# Patient Record
Sex: Male | Born: 1965 | Race: White | Hispanic: No | Marital: Married | Smoking: Never smoker
Health system: Southern US, Community
[De-identification: ages and names within clinical notes are randomized; demographics above are authoritative.]

## PROBLEM LIST (undated history)

## (undated) DIAGNOSIS — K219 Gastro-esophageal reflux disease without esophagitis: Secondary | ICD-10-CM

## (undated) DIAGNOSIS — G35D Multiple sclerosis, unspecified: Secondary | ICD-10-CM

## (undated) DIAGNOSIS — K449 Diaphragmatic hernia without obstruction or gangrene: Secondary | ICD-10-CM

## (undated) DIAGNOSIS — K3189 Other diseases of stomach and duodenum: Secondary | ICD-10-CM

## (undated) DIAGNOSIS — E7800 Pure hypercholesterolemia, unspecified: Secondary | ICD-10-CM

## (undated) DIAGNOSIS — G35 Multiple sclerosis: Secondary | ICD-10-CM

## (undated) DIAGNOSIS — R1013 Epigastric pain: Secondary | ICD-10-CM

## (undated) DIAGNOSIS — J36 Peritonsillar abscess: Secondary | ICD-10-CM

## (undated) HISTORY — DX: Multiple sclerosis, unspecified: G35.D

## (undated) HISTORY — DX: Other disorders of bilirubin metabolism: E80.6

## (undated) HISTORY — DX: Diaphragmatic hernia without obstruction or gangrene: K44.9

## (undated) HISTORY — DX: Pure hypercholesterolemia, unspecified: E78.00

## (undated) HISTORY — PX: WISDOM TOOTH EXTRACTION: SHX21

## (undated) HISTORY — DX: Peritonsillar abscess: J36

## (undated) HISTORY — DX: Epigastric pain: R10.13

## (undated) HISTORY — PX: CERVICAL DISCECTOMY: SHX98

## (undated) HISTORY — DX: Multiple sclerosis: G35

## (undated) HISTORY — PX: LASIK: SHX215

## (undated) HISTORY — DX: Other diseases of stomach and duodenum: K31.89

---

## 2009-12-19 ENCOUNTER — Encounter: Payer: Self-pay | Admitting: Family Medicine

## 2009-12-19 LAB — CONVERTED CEMR LAB
Cholesterol: 203 mg/dL
Triglycerides: 175 mg/dL

## 2010-09-14 ENCOUNTER — Encounter: Payer: Self-pay | Admitting: Family Medicine

## 2010-09-14 DIAGNOSIS — K449 Diaphragmatic hernia without obstruction or gangrene: Secondary | ICD-10-CM | POA: Insufficient documentation

## 2010-09-14 DIAGNOSIS — J36 Peritonsillar abscess: Secondary | ICD-10-CM | POA: Insufficient documentation

## 2010-09-14 DIAGNOSIS — E7800 Pure hypercholesterolemia, unspecified: Secondary | ICD-10-CM | POA: Insufficient documentation

## 2010-09-14 DIAGNOSIS — K3189 Other diseases of stomach and duodenum: Secondary | ICD-10-CM | POA: Insufficient documentation

## 2010-09-14 DIAGNOSIS — R1013 Epigastric pain: Secondary | ICD-10-CM

## 2010-10-23 ENCOUNTER — Encounter: Payer: Self-pay | Admitting: Family Medicine

## 2010-10-27 ENCOUNTER — Encounter: Payer: Self-pay | Admitting: Family Medicine

## 2010-10-27 ENCOUNTER — Ambulatory Visit (INDEPENDENT_AMBULATORY_CARE_PROVIDER_SITE_OTHER): Payer: Self-pay | Admitting: Family Medicine

## 2010-10-27 DIAGNOSIS — R1013 Epigastric pain: Secondary | ICD-10-CM

## 2010-10-27 DIAGNOSIS — E7800 Pure hypercholesterolemia, unspecified: Secondary | ICD-10-CM

## 2010-10-27 DIAGNOSIS — J069 Acute upper respiratory infection, unspecified: Secondary | ICD-10-CM | POA: Insufficient documentation

## 2010-10-27 DIAGNOSIS — M5090 Cervical disc disorder, unspecified, unspecified cervical region: Secondary | ICD-10-CM

## 2010-10-27 DIAGNOSIS — G35 Multiple sclerosis: Secondary | ICD-10-CM | POA: Insufficient documentation

## 2010-10-27 DIAGNOSIS — K3189 Other diseases of stomach and duodenum: Secondary | ICD-10-CM

## 2010-10-27 MED ORDER — ATORVASTATIN CALCIUM 20 MG PO TABS: 20.0000 mg | ORAL_TABLET | Freq: Every day | ORAL | Status: DC

## 2010-10-27 MED ORDER — LANSOPRAZOLE 30 MG PO CPDR: 30.0000 mg | DELAYED_RELEASE_CAPSULE | Freq: Every day | ORAL | Status: DC

## 2010-10-27 NOTE — Assessment & Plan Note (Signed)
Check lipids in the summer.  Continue meds for now.

## 2010-10-27 NOTE — Assessment & Plan Note (Signed)
Controlled with current meds.

## 2010-10-27 NOTE — Assessment & Plan Note (Signed)
Per neuro 

## 2010-10-27 NOTE — Progress Notes (Signed)
Reest care. Prev [facility] patient.   Elevated Cholesterol: Using medications without problems:yes Muscle aches: no Other complaints:no Exercising Due for labs in the summer.   MS- has had follow up with neuro.  Takes tylenol prn for sweats with the interferon.  Only with sensory sx- dec in sensation on R post thigh, R foot, and R chest wall.  No weakness.  Compliant with meds.    Recent URI sx.  Cough and congestion w/o fever.  Going on for about 1 week.  Some sputum.  Overall with gradual improvement.   Known HH and GERD- controlled with current meds.   PMH and SH reviewed  Meds, vitals, and allergies reviewed.   ROS: See HPI.  Otherwise negative.    GEN: nad, alert and oriented HEENT: mucous membranes moist, tm w/o erythema, nasal exam w/o erythema, clear discharge noted,  OP with cobblestoning, frontal/max sinuses not ttp NECK: supple w/o LA CV: rrr.   PULM: ctab, no inc wob EXT: no edema SKIN: no acute rash CN 2-12 wnl B, S/S/DTR wnl x4, except for dec in sensation on R side of chest and as above on R leg.

## 2010-10-27 NOTE — Patient Instructions (Signed)
Schedule a lab appointment in: 3months, come in fasting. Don't change your meds in the meantime.  Let me know if your cough doesn't gradually improve/resolve. Take care. Glad to see you today.

## 2010-10-27 NOTE — Assessment & Plan Note (Addendum)
See instructions.  Supportive tx and notify the clinic if not resolving or if progressive.  He agrees.  Nontoxic and likely viral.  Lungs clear.

## 2011-01-27 ENCOUNTER — Other Ambulatory Visit (INDEPENDENT_AMBULATORY_CARE_PROVIDER_SITE_OTHER): Payer: Federal, State, Local not specified - PPO

## 2011-01-27 DIAGNOSIS — E7800 Pure hypercholesterolemia, unspecified: Secondary | ICD-10-CM

## 2011-01-27 LAB — LIPID PANEL
HDL: 51.7 mg/dL (ref >39.00)
Total CHOL/HDL Ratio: 4
Triglycerides: 114 mg/dL (ref 0.0–149.0)
VLDL: 22.8 mg/dL (ref 0.0–40.0)

## 2011-09-14 ENCOUNTER — Telehealth: Payer: Self-pay | Admitting: *Deleted

## 2011-09-14 NOTE — Telephone Encounter (Signed)
Lyla Son says the patient called in this morning to make an appt for a CPE and was upset that we could not give that to him until July.  Lyla Son asked if she could fit him in sooner than that because he needed the paperwork for SS filled out.  Appt was made in May which suited the patient but then the wife brought in the form and left it with a notation that they need it by Thursday.  Form in your in box.

## 2011-09-15 NOTE — Telephone Encounter (Signed)
I've filled out the form based on the information from 2/12.  That info won't be valid in May 2013.  I would shred this form and bring in a clean one in May.  I wouldn't hold the current form.

## 2011-09-15 NOTE — Telephone Encounter (Signed)
Spoke to patient and was advised that he is not coming in until May and will have the TB test done at that time. Hold form until that appointment per patient.

## 2011-09-15 NOTE — Telephone Encounter (Signed)
Patient's wife notified by telephone to have him bring in a new form to be completed at his appointment.

## 2011-09-15 NOTE — Telephone Encounter (Signed)
Pt needs a TB test result that is current (from the last year).  If he has one, I'll add it to the form. If not, he'll need another one placed and then read 48-72 hours later.  That will mean the form won't be done by Thursday.

## 2011-09-15 NOTE — Telephone Encounter (Signed)
Left message on voice mail  to call back

## 2011-09-16 ENCOUNTER — Telehealth: Payer: Self-pay | Admitting: Family Medicine

## 2011-09-16 NOTE — Telephone Encounter (Signed)
If the prev form was shredded, then have them send a clean copy and I'll fill out.  If we still have the old one, I'll finish it.

## 2011-09-16 NOTE — Telephone Encounter (Signed)
Pt's wife is calling about a form that was dropped off for her husband and Rene Kocher was working with her on it. Rene Kocher called to tell them know that they couldn't complete the form without her husband getting a TB skin test. She called her social worker and she said the he doesn't have to have a TB skin test every year for the form because it is a renewal. She said if there were any questions to contact her or the Child psychotherapist. Number to Social worker Frederich Balding: Cell [phone]. [phone]  Work [phone]

## 2011-09-16 NOTE — Telephone Encounter (Signed)
Pt came and picked up form and I sent a copy to be scanned.

## 2011-09-16 NOTE — Telephone Encounter (Signed)
Form is back in your in box.

## 2011-09-16 NOTE — Telephone Encounter (Signed)
Signed, please scan and give to pt.

## 2011-10-19 ENCOUNTER — Other Ambulatory Visit: Payer: Self-pay | Admitting: *Deleted

## 2011-10-19 DIAGNOSIS — R1013 Epigastric pain: Secondary | ICD-10-CM

## 2011-10-19 DIAGNOSIS — E7800 Pure hypercholesterolemia, unspecified: Secondary | ICD-10-CM

## 2011-10-19 MED ORDER — ATORVASTATIN CALCIUM 20 MG PO TABS: 20.0000 mg | ORAL_TABLET | Freq: Every day | ORAL | Status: DC

## 2011-10-19 MED ORDER — LANSOPRAZOLE 30 MG PO CPDR: 30.0000 mg | DELAYED_RELEASE_CAPSULE | Freq: Every day | ORAL | Status: DC

## 2011-12-06 ENCOUNTER — Encounter: Payer: Self-pay | Admitting: Family Medicine

## 2011-12-06 ENCOUNTER — Ambulatory Visit (INDEPENDENT_AMBULATORY_CARE_PROVIDER_SITE_OTHER): Payer: Federal, State, Local not specified - PPO | Admitting: Family Medicine

## 2011-12-06 VITALS — BP 124/76 | HR 73 | Temp 98.6°F | Wt 236.0 lb

## 2011-12-06 DIAGNOSIS — M5090 Cervical disc disorder, unspecified, unspecified cervical region: Secondary | ICD-10-CM

## 2011-12-06 DIAGNOSIS — Z0000 Encounter for general adult medical examination without abnormal findings: Secondary | ICD-10-CM

## 2011-12-06 DIAGNOSIS — E7800 Pure hypercholesterolemia, unspecified: Secondary | ICD-10-CM

## 2011-12-06 DIAGNOSIS — G35 Multiple sclerosis: Secondary | ICD-10-CM

## 2011-12-06 LAB — COMPREHENSIVE METABOLIC PANEL
AST: 48 U/L — ABNORMAL HIGH (ref 0–37)
BUN: 21 mg/dL (ref 6–23)
Calcium: 9.3 mg/dL (ref 8.4–10.5)
Chloride: 107 mEq/L (ref 96–112)
Creatinine, Ser: 1 mg/dL (ref 0.4–1.5)
GFR: 81.95 mL/min (ref >60.00)
Total Bilirubin: 3.2 mg/dL — ABNORMAL HIGH (ref 0.3–1.2)

## 2011-12-06 LAB — LIPID PANEL
Cholesterol: 217 mg/dL — ABNORMAL HIGH (ref 0–200)
Total CHOL/HDL Ratio: 4
Triglycerides: 119 mg/dL (ref 0.0–149.0)
VLDL: 23.8 mg/dL (ref 0.0–40.0)

## 2011-12-06 NOTE — Patient Instructions (Addendum)
We'll contact you with your lab report. Check your lipitor dose and notify us.  Take care.  Glad to see you.

## 2011-12-06 NOTE — Progress Notes (Signed)
CPE- See plan.  Routine anticipatory guidance given to patient.  See health maintenance. Due for labs.   Td 2008 Flu done at work  Elevated Cholesterol: Using medications without problems:yes Muscle aches: no, pain likely due to neck pain or possibly MS Diet compliance: yes Exercise: as tolerated, limited by pain  MS per neuro.  On interferon.  Has f/u with neurosurgery later today about neck pain.    PMH and SH reviewed  Meds, vitals, and allergies reviewed.   ROS: See HPI.  Otherwise negative.    GEN: nad, alert and oriented HEENT: mucous membranes moist NECK: w/o LA or TMG CV: rrr. PULM: ctab, no inc wob ABD: soft, +bs EXT: no edema SKIN: no acute rash

## 2011-12-07 DIAGNOSIS — K760 Fatty (change of) liver, not elsewhere classified: Secondary | ICD-10-CM | POA: Insufficient documentation

## 2011-12-07 NOTE — Assessment & Plan Note (Signed)
Mild, recheck in 1 month.  See notes on labs.

## 2011-12-09 DIAGNOSIS — Z0000 Encounter for general adult medical examination without abnormal findings: Secondary | ICD-10-CM | POA: Insufficient documentation

## 2011-12-09 NOTE — Assessment & Plan Note (Signed)
Routine anticipatory guidance given to patient.  See health maintenance. Due for labs.   See notes on labs.  Td 2008 Flu done at work

## 2011-12-09 NOTE — Assessment & Plan Note (Signed)
Per neurosurgery  

## 2011-12-09 NOTE — Assessment & Plan Note (Signed)
Per neuro 

## 2011-12-31 ENCOUNTER — Other Ambulatory Visit (INDEPENDENT_AMBULATORY_CARE_PROVIDER_SITE_OTHER): Payer: Federal, State, Local not specified - PPO

## 2011-12-31 ENCOUNTER — Other Ambulatory Visit: Payer: Self-pay | Admitting: Family Medicine

## 2011-12-31 DIAGNOSIS — R1013 Epigastric pain: Secondary | ICD-10-CM

## 2011-12-31 DIAGNOSIS — K3189 Other diseases of stomach and duodenum: Secondary | ICD-10-CM

## 2011-12-31 LAB — HEPATIC FUNCTION PANEL
ALT: 93 U/L — ABNORMAL HIGH (ref 0–53)
Alkaline Phosphatase: 62 U/L (ref 39–117)
Bilirubin, Direct: 0.2 mg/dL (ref 0.0–0.3)
Total Bilirubin: 2.1 mg/dL — ABNORMAL HIGH (ref 0.3–1.2)

## 2011-12-31 MED ORDER — ATORVASTATIN CALCIUM 10 MG PO TABS: 10.0000 mg | ORAL_TABLET | Freq: Every day | ORAL | Status: DC

## 2011-12-31 MED ORDER — LANSOPRAZOLE 30 MG PO CPDR: 30.0000 mg | DELAYED_RELEASE_CAPSULE | Freq: Every day | ORAL | Status: DC

## 2011-12-31 NOTE — Progress Notes (Signed)
Addended by: Baldomero Lamy on: 12/31/2011 07:59 AM   Modules accepted: Orders

## 2012-01-06 ENCOUNTER — Other Ambulatory Visit: Payer: Self-pay | Admitting: Neurological Surgery

## 2012-01-17 ENCOUNTER — Other Ambulatory Visit (INDEPENDENT_AMBULATORY_CARE_PROVIDER_SITE_OTHER): Payer: Federal, State, Local not specified - PPO

## 2012-01-18 LAB — HEPATITIS B SURFACE ANTIGEN: Hepatitis B Surface Ag: NEGATIVE

## 2012-01-20 ENCOUNTER — Ambulatory Visit
Admission: RE | Admit: 2012-01-20 | Discharge: 2012-01-20 | Disposition: A | Discharge status: Home / Self Care | Payer: Federal, State, Local not specified - PPO | Source: Ambulatory Visit | Attending: Family Medicine | Admitting: Family Medicine

## 2012-01-20 ENCOUNTER — Encounter: Payer: Self-pay | Admitting: Family Medicine

## 2012-02-02 ENCOUNTER — Encounter (HOSPITAL_COMMUNITY): Payer: Self-pay | Admitting: Pharmacy Technician

## 2012-02-09 ENCOUNTER — Encounter (HOSPITAL_COMMUNITY)
Admission: RE | Admit: 2012-02-09 | Discharge: 2012-02-09 | Disposition: A | Discharge status: Home / Self Care | Payer: Federal, State, Local not specified - PPO | Source: Ambulatory Visit | Attending: Neurological Surgery | Admitting: Neurological Surgery

## 2012-02-09 ENCOUNTER — Encounter (HOSPITAL_COMMUNITY): Payer: Self-pay

## 2012-02-09 HISTORY — DX: Gastro-esophageal reflux disease without esophagitis: K21.9

## 2012-02-09 LAB — CBC WITH DIFFERENTIAL/PLATELET
Basophils Absolute: 0 10*3/uL (ref 0.0–0.1)
Basophils Relative: 0 % (ref 0–1)
HCT: 46.2 % (ref 39.0–52.0)
Hemoglobin: 16.6 g/dL (ref 13.0–17.0)
Lymphocytes Relative: 34 % (ref 12–46)
MCHC: 35.9 g/dL (ref 30.0–36.0)
Monocytes Absolute: 0.7 10*3/uL (ref 0.1–1.0)
Monocytes Relative: 14 % — ABNORMAL HIGH (ref 3–12)
Neutro Abs: 2.2 10*3/uL (ref 1.7–7.7)
Neutrophils Relative %: 48 % (ref 43–77)
WBC: 4.7 10*3/uL (ref 4.0–10.5)

## 2012-02-09 LAB — BASIC METABOLIC PANEL
BUN: 18 mg/dL (ref 6–23)
CO2: 27 mEq/L (ref 19–32)
Chloride: 102 mEq/L (ref 96–112)
Creatinine, Ser: 1.12 mg/dL (ref 0.50–1.35)
GFR calc Af Amer: 88 mL/min — ABNORMAL LOW (ref >90)
Potassium: 3.9 mEq/L (ref 3.5–5.1)

## 2012-02-09 LAB — PROTIME-INR: Prothrombin Time: 13.2 seconds (ref 11.6–15.2)

## 2012-02-09 LAB — SURGICAL PCR SCREEN: MRSA, PCR: NEGATIVE

## 2012-02-09 NOTE — Pre-Procedure Instructions (Signed)
18 Walter Rocha  02/09/2012   Your procedure is scheduled on:  February 16, 2012 Wednesday at 0830 AM  Report to [facility] at [phone].  Call this number if you have problems the morning of surgery: [phone]   Remember:   Do not eat food or drink:After Midnight.Tuesday   .  Take these medicines the morning of surgery with A SIP OF WATER: Tylenol, and Prevacid   Do not wear jewelry,   Do not wear lotions, powders, or perfumes. You may wear deodorant.  Do not shave 48 hours prior to surgery. Men may shave face and neck.  Do not bring valuables to the hospital.  Contacts, dentures or bridgework may not be worn into surgery.  Leave suitcase in the car. After surgery it may be brought to your room.  For patients admitted to the hospital, checkout time is 11:00 AM the day of discharge.   Patients discharged the day of surgery will not be allowed to drive home.    Special Instructions: CHG Shower Use Special Wash: 1/2 bottle night before surgery and 1/2 bottle morning of surgery.   Please read over the following fact sheets that you were given: Pain Booklet, Coughing and Deep Breathing, MRSA Information and Surgical Site Infection Prevention

## 2012-02-15 MED ORDER — DEXAMETHASONE SODIUM PHOSPHATE 10 MG/ML IJ SOLN
10.0000 mg | INTRAMUSCULAR | Status: AC
  Administered 2012-02-16: 10 mg via INTRAVENOUS
  Filled 2012-02-15: qty 1

## 2012-02-15 MED ORDER — CEFAZOLIN SODIUM-DEXTROSE 2-3 GM-% IV SOLR
2.0000 g | INTRAVENOUS | Status: DC
  Filled 2012-02-15: qty 50

## 2012-02-16 ENCOUNTER — Encounter (HOSPITAL_COMMUNITY): Payer: Self-pay | Admitting: Certified Registered"

## 2012-02-16 ENCOUNTER — Encounter (HOSPITAL_COMMUNITY): Payer: Self-pay | Admitting: Surgery

## 2012-02-16 ENCOUNTER — Encounter (HOSPITAL_COMMUNITY): Payer: Self-pay | Admitting: Neurological Surgery

## 2012-02-16 ENCOUNTER — Ambulatory Visit (HOSPITAL_COMMUNITY): Payer: Federal, State, Local not specified - PPO | Admitting: Certified Registered"

## 2012-02-16 ENCOUNTER — Encounter (HOSPITAL_COMMUNITY)
Admission: RE | Disposition: A | Discharge status: Home / Self Care | Payer: Self-pay | Source: Ambulatory Visit | Attending: Neurological Surgery

## 2012-02-16 ENCOUNTER — Ambulatory Visit (HOSPITAL_COMMUNITY)
Admission: RE | Admit: 2012-02-16 | Discharge: 2012-02-17 | Disposition: A | Discharge status: Home / Self Care | Payer: Federal, State, Local not specified - PPO | Source: Ambulatory Visit | Attending: Neurological Surgery | Admitting: Neurological Surgery

## 2012-02-16 ENCOUNTER — Ambulatory Visit (HOSPITAL_COMMUNITY): Payer: Federal, State, Local not specified - PPO

## 2012-02-16 DIAGNOSIS — Z01818 Encounter for other preprocedural examination: Secondary | ICD-10-CM | POA: Insufficient documentation

## 2012-02-16 DIAGNOSIS — M5090 Cervical disc disorder, unspecified, unspecified cervical region: Secondary | ICD-10-CM

## 2012-02-16 DIAGNOSIS — M47812 Spondylosis without myelopathy or radiculopathy, cervical region: Secondary | ICD-10-CM | POA: Insufficient documentation

## 2012-02-16 DIAGNOSIS — K219 Gastro-esophageal reflux disease without esophagitis: Secondary | ICD-10-CM | POA: Insufficient documentation

## 2012-02-16 DIAGNOSIS — Z01810 Encounter for preprocedural cardiovascular examination: Secondary | ICD-10-CM | POA: Insufficient documentation

## 2012-02-16 DIAGNOSIS — R1013 Epigastric pain: Secondary | ICD-10-CM

## 2012-02-16 DIAGNOSIS — G35 Multiple sclerosis: Secondary | ICD-10-CM | POA: Insufficient documentation

## 2012-02-16 DIAGNOSIS — Z01812 Encounter for preprocedural laboratory examination: Secondary | ICD-10-CM | POA: Insufficient documentation

## 2012-02-16 HISTORY — PX: ANTERIOR CERVICAL DECOMP/DISCECTOMY FUSION: SHX1161

## 2012-02-16 SURGERY — ANTERIOR CERVICAL DECOMPRESSION/DISCECTOMY FUSION 2 LEVELS
Anesthesia: General | Site: Spine Cervical | Wound class: Clean

## 2012-02-16 MED ORDER — LACTATED RINGERS IV SOLN
INTRAVENOUS | Status: DC | PRN
  Administered 2012-02-16 (×2): via INTRAVENOUS

## 2012-02-16 MED ORDER — SODIUM CHLORIDE 0.9 % IJ SOLN
3.0000 mL | Freq: Two times a day (BID) | INTRAMUSCULAR | Status: DC
  Administered 2012-02-16 (×2): 3 mL via INTRAVENOUS

## 2012-02-16 MED ORDER — PHENYLEPHRINE HCL 10 MG/ML IJ SOLN
INTRAMUSCULAR | Status: DC | PRN
  Administered 2012-02-16 (×3): 80 ug via INTRAVENOUS

## 2012-02-16 MED ORDER — ZOLPIDEM TARTRATE 5 MG PO TABS: 5.0000 mg | ORAL_TABLET | Freq: Every evening | ORAL | Status: DC | PRN

## 2012-02-16 MED ORDER — ROCURONIUM BROMIDE 100 MG/10ML IV SOLN
INTRAVENOUS | Status: DC | PRN
  Administered 2012-02-16: 10 mg via INTRAVENOUS
  Administered 2012-02-16: 50 mg via INTRAVENOUS
  Administered 2012-02-16 (×3): 10 mg via INTRAVENOUS

## 2012-02-16 MED ORDER — FENTANYL CITRATE 0.05 MG/ML IJ SOLN
INTRAMUSCULAR | Status: DC | PRN
  Administered 2012-02-16 (×2): 50 ug via INTRAVENOUS
  Administered 2012-02-16: 100 ug via INTRAVENOUS
  Administered 2012-02-16: 50 ug via INTRAVENOUS
  Administered 2012-02-16: 100 ug via INTRAVENOUS

## 2012-02-16 MED ORDER — GLYCOPYRROLATE 0.2 MG/ML IJ SOLN
INTRAMUSCULAR | Status: DC | PRN
  Administered 2012-02-16: 0.4 mg via INTRAVENOUS

## 2012-02-16 MED ORDER — SODIUM CHLORIDE 0.9 % IR SOLN
Status: DC | PRN
  Administered 2012-02-16: 09:00:00

## 2012-02-16 MED ORDER — ACETAMINOPHEN 650 MG RE SUPP: 650.0000 mg | RECTAL | Status: DC | PRN

## 2012-02-16 MED ORDER — MIDAZOLAM HCL 5 MG/5ML IJ SOLN
INTRAMUSCULAR | Status: DC | PRN
  Administered 2012-02-16: 2 mg via INTRAVENOUS

## 2012-02-16 MED ORDER — HYDROMORPHONE HCL PF 1 MG/ML IJ SOLN: 0.2500 mg | INTRAMUSCULAR | Status: DC | PRN

## 2012-02-16 MED ORDER — SODIUM CHLORIDE 0.9 % IJ SOLN: 3.0000 mL | INTRAMUSCULAR | Status: DC | PRN

## 2012-02-16 MED ORDER — SODIUM CHLORIDE 0.9 % IV SOLN
INTRAVENOUS | Status: AC
  Filled 2012-02-16: qty 500

## 2012-02-16 MED ORDER — CYCLOBENZAPRINE HCL 10 MG PO TABS
10.0000 mg | ORAL_TABLET | Freq: Three times a day (TID) | ORAL | Status: DC | PRN
  Administered 2012-02-16 – 2012-02-17 (×2): 10 mg via ORAL
  Filled 2012-02-16 (×2): qty 1

## 2012-02-16 MED ORDER — MENTHOL 3 MG MT LOZG
1.0000 | LOZENGE | OROMUCOSAL | Status: DC | PRN
  Administered 2012-02-16: 3 mg via ORAL
  Filled 2012-02-16: qty 9

## 2012-02-16 MED ORDER — LIDOCAINE HCL (CARDIAC) 20 MG/ML IV SOLN
INTRAVENOUS | Status: DC | PRN
  Administered 2012-02-16: 100 mg via INTRAVENOUS

## 2012-02-16 MED ORDER — THROMBIN 5000 UNITS EX KIT
PACK | CUTANEOUS | Status: DC | PRN
  Administered 2012-02-16 (×2): 5000 [IU] via TOPICAL

## 2012-02-16 MED ORDER — OXYCODONE-ACETAMINOPHEN 5-325 MG PO TABS: 1.0000 | ORAL_TABLET | ORAL | Status: DC | PRN

## 2012-02-16 MED ORDER — ONDANSETRON HCL 4 MG/2ML IJ SOLN: 4.0000 mg | INTRAMUSCULAR | Status: DC | PRN

## 2012-02-16 MED ORDER — BUPIVACAINE HCL (PF) 0.25 % IJ SOLN
INTRAMUSCULAR | Status: DC | PRN
  Administered 2012-02-16: 4 mL

## 2012-02-16 MED ORDER — BACITRACIN 50000 UNITS IM SOLR
INTRAMUSCULAR | Status: AC
  Filled 2012-02-16: qty 1

## 2012-02-16 MED ORDER — CEFAZOLIN SODIUM 1-5 GM-% IV SOLN
1.0000 g | Freq: Three times a day (TID) | INTRAVENOUS | Status: AC
  Administered 2012-02-16 (×2): 1 g via INTRAVENOUS
  Filled 2012-02-16 (×2): qty 50

## 2012-02-16 MED ORDER — EPHEDRINE SULFATE 50 MG/ML IJ SOLN
INTRAMUSCULAR | Status: DC | PRN
  Administered 2012-02-16 (×2): 5 mg via INTRAVENOUS

## 2012-02-16 MED ORDER — SENNA 8.6 MG PO TABS
1.0000 | ORAL_TABLET | Freq: Two times a day (BID) | ORAL | Status: DC
  Administered 2012-02-17: 8.6 mg via ORAL
  Filled 2012-02-16 (×2): qty 1

## 2012-02-16 MED ORDER — PROMETHAZINE HCL 25 MG/ML IJ SOLN: 6.2500 mg | INTRAMUSCULAR | Status: DC | PRN

## 2012-02-16 MED ORDER — NEOSTIGMINE METHYLSULFATE 1 MG/ML IJ SOLN
INTRAMUSCULAR | Status: DC | PRN
  Administered 2012-02-16: 3 mg via INTRAVENOUS

## 2012-02-16 MED ORDER — HEMOSTATIC AGENTS (NO CHARGE) OPTIME
TOPICAL | Status: DC | PRN
  Administered 2012-02-16: 1 via TOPICAL

## 2012-02-16 MED ORDER — KETOROLAC TROMETHAMINE 30 MG/ML IJ SOLN: 15.0000 mg | Freq: Once | INTRAMUSCULAR | Status: DC | PRN

## 2012-02-16 MED ORDER — HYDROMORPHONE HCL PF 1 MG/ML IJ SOLN: 0.5000 mg | INTRAMUSCULAR | Status: DC | PRN

## 2012-02-16 MED ORDER — PHENOL 1.4 % MT LIQD: 1.0000 | OROMUCOSAL | Status: DC | PRN

## 2012-02-16 MED ORDER — THROMBIN 5000 UNITS EX SOLR
OROMUCOSAL | Status: DC | PRN
  Administered 2012-02-16: 09:00:00 via TOPICAL

## 2012-02-16 MED ORDER — 0.9 % SODIUM CHLORIDE (POUR BTL) OPTIME
TOPICAL | Status: DC | PRN
  Administered 2012-02-16: 1000 mL

## 2012-02-16 MED ORDER — PROPOFOL 10 MG/ML IV EMUL
INTRAVENOUS | Status: DC | PRN
  Administered 2012-02-16: 200 mg via INTRAVENOUS

## 2012-02-16 MED ORDER — ACETAMINOPHEN 325 MG PO TABS
650.0000 mg | ORAL_TABLET | ORAL | Status: DC | PRN
  Administered 2012-02-16 – 2012-02-17 (×3): 650 mg via ORAL
  Filled 2012-02-16 (×3): qty 2

## 2012-02-16 MED ORDER — MEPERIDINE HCL 25 MG/ML IJ SOLN: 6.2500 mg | INTRAMUSCULAR | Status: DC | PRN

## 2012-02-16 MED ORDER — ONDANSETRON HCL 4 MG/2ML IJ SOLN
INTRAMUSCULAR | Status: DC | PRN
  Administered 2012-02-16: 4 mg via INTRAVENOUS

## 2012-02-16 MED ORDER — POTASSIUM CHLORIDE IN NACL 20-0.9 MEQ/L-% IV SOLN
INTRAVENOUS | Status: DC
  Filled 2012-02-16 (×3): qty 1000

## 2012-02-16 SURGICAL SUPPLY — 53 items
BAG DECANTER FOR FLEXI CONT (MISCELLANEOUS) ×2 IMPLANT
BENZOIN TINCTURE PRP APPL 2/3 (GAUZE/BANDAGES/DRESSINGS) ×2 IMPLANT
BUR MATCHSTICK NEURO 3.0 LAGG (BURR) ×2 IMPLANT
CAGE COROENT 9X13X15 (Cage) ×2 IMPLANT
CAGE LORDOTIC 8 SM PLUS (Cage) ×2 IMPLANT
CANISTER SUCTION 2500CC (MISCELLANEOUS) ×2 IMPLANT
CLOTH BEACON ORANGE TIMEOUT ST (SAFETY) ×2 IMPLANT
CONT SPEC 4OZ CLIKSEAL STRL BL (MISCELLANEOUS) ×2 IMPLANT
DRAPE C-ARM 42X72 X-RAY (DRAPES) ×4 IMPLANT
DRAPE LAPAROTOMY 100X72 PEDS (DRAPES) ×2 IMPLANT
DRAPE MICROSCOPE LEICA (MISCELLANEOUS) ×2 IMPLANT
DRAPE MICROSCOPE ZEISS OPMI (DRAPES) IMPLANT
DRAPE POUCH INSTRU U-SHP 10X18 (DRAPES) ×2 IMPLANT
DRESSING TELFA 8X3 (GAUZE/BANDAGES/DRESSINGS) ×2 IMPLANT
DRILL BIT HELIX 13MM (BIT) ×2 IMPLANT
DRSG OPSITE 4X5.5 SM (GAUZE/BANDAGES/DRESSINGS) ×2 IMPLANT
DURAPREP 6ML APPLICATOR 50/CS (WOUND CARE) ×2 IMPLANT
ELECT COATED BLADE 2.86 ST (ELECTRODE) ×2 IMPLANT
ELECT REM PT RETURN 9FT ADLT (ELECTROSURGICAL) ×2
ELECTRODE REM PT RTRN 9FT ADLT (ELECTROSURGICAL) ×1 IMPLANT
GAUZE SPONGE 4X4 16PLY XRAY LF (GAUZE/BANDAGES/DRESSINGS) IMPLANT
GLOVE BIO SURGEON STRL SZ8 (GLOVE) ×2 IMPLANT
GLOVE BIOGEL PI IND STRL 7.0 (GLOVE) ×2 IMPLANT
GLOVE BIOGEL PI IND STRL 8 (GLOVE) ×1 IMPLANT
GLOVE BIOGEL PI INDICATOR 7.0 (GLOVE) ×2
GLOVE BIOGEL PI INDICATOR 8 (GLOVE) ×1
GLOVE ECLIPSE 7.5 STRL STRAW (GLOVE) ×2 IMPLANT
GLOVE SS BIOGEL STRL SZ 6.5 (GLOVE) ×2 IMPLANT
GLOVE SUPERSENSE BIOGEL SZ 6.5 (GLOVE) ×2
GOWN BRE IMP SLV AUR LG STRL (GOWN DISPOSABLE) ×4 IMPLANT
GOWN BRE IMP SLV AUR XL STRL (GOWN DISPOSABLE) ×4 IMPLANT
GOWN STRL REIN 2XL LVL4 (GOWN DISPOSABLE) IMPLANT
HEMOSTAT POWDER KIT SURGIFOAM (HEMOSTASIS) ×2 IMPLANT
KIT BASIN OR (CUSTOM PROCEDURE TRAY) ×2 IMPLANT
KIT ROOM TURNOVER OR (KITS) ×2 IMPLANT
NEEDLE HYPO 25X1 1.5 SAFETY (NEEDLE) ×2 IMPLANT
NEEDLE SPNL 20GX3.5 QUINCKE YW (NEEDLE) ×2 IMPLANT
NS IRRIG 1000ML POUR BTL (IV SOLUTION) ×2 IMPLANT
PACK LAMINECTOMY NEURO (CUSTOM PROCEDURE TRAY) ×2 IMPLANT
PAD ARMBOARD 7.5X6 YLW CONV (MISCELLANEOUS) ×6 IMPLANT
PLATE R HELIX 38MM (Plate) ×2 IMPLANT
PUTTY BONE DBX 2.5 MIS (Bone Implant) ×2 IMPLANT
RUBBERBAND STERILE (MISCELLANEOUS) ×4 IMPLANT
SCREW SELF TAP 15MM (Screw) ×12 IMPLANT
SPONGE INTESTINAL PEANUT (DISPOSABLE) ×2 IMPLANT
SPONGE SURGIFOAM ABS GEL SZ50 (HEMOSTASIS) ×2 IMPLANT
STRIP CLOSURE SKIN 1/2X4 (GAUZE/BANDAGES/DRESSINGS) ×2 IMPLANT
SUT VIC AB 3-0 SH 8-18 (SUTURE) ×4 IMPLANT
SYR 20ML ECCENTRIC (SYRINGE) ×2 IMPLANT
TOWEL OR 17X24 6PK STRL BLUE (TOWEL DISPOSABLE) ×2 IMPLANT
TOWEL OR 17X26 10 PK STRL BLUE (TOWEL DISPOSABLE) ×2 IMPLANT
TRAP SPECIMEN MUCOUS 40CC (MISCELLANEOUS) ×2 IMPLANT
WATER STERILE IRR 1000ML POUR (IV SOLUTION) ×2 IMPLANT

## 2012-02-16 NOTE — Anesthesia Procedure Notes (Signed)
Procedure Name: Intubation Date/Time: 02/16/2012 8:29 AM Performed by: Jefm Miles E Pre-anesthesia Checklist: Patient identified, Emergency Drugs available, Suction available, Patient being monitored and Timeout performed Patient Re-evaluated:Patient Re-evaluated prior to inductionOxygen Delivery Method: Circle system utilized Preoxygenation: Pre-oxygenation with 100% oxygen Intubation Type: IV induction Ventilation: Mask ventilation without difficulty and Oral airway inserted - appropriate to patient size Laryngoscope Size: Mac and 3 Grade View: Grade I Tube type: Oral Tube size: 7.5 mm Number of attempts: 1 Airway Equipment and Method: Stylet Placement Confirmation: ETT inserted through vocal cords under direct vision,  breath sounds checked- equal and bilateral and positive ETCO2 Secured at: 22 cm Tube secured with: Tape Dental Injury: Teeth and Oropharynx as per pre-operative assessment

## 2012-02-16 NOTE — Anesthesia Preprocedure Evaluation (Addendum)
Anesthesia Evaluation  Patient identified by MRN, date of birth, ID band Patient awake    Reviewed: Allergy & Precautions, H&P , NPO status , Patient's Chart, lab work & pertinent test results  Airway Mallampati: I TM Distance: >3 FB Neck ROM: Full    Dental  (+) Teeth Intact and Dental Advisory Given   Pulmonary  breath sounds clear to auscultation        Cardiovascular Rhythm:Regular Rate:Normal     Neuro/Psych  Neuromuscular disease    GI/Hepatic hiatal hernia, GERD-  Medicated and Controlled,  Endo/Other    Renal/GU      Musculoskeletal   Abdominal   Peds  Hematology   Anesthesia Other Findings Multiple sclerosis  Reproductive/Obstetrics                          Anesthesia Physical Anesthesia Plan  ASA: II  Anesthesia Plan: General   Post-op Pain Management:    Induction: Intravenous  Airway Management Planned: Oral ETT  Additional Equipment:   Intra-op Plan:   Post-operative Plan: Extubation in OR  Informed Consent: I have reviewed the patients History and Physical, chart, labs and discussed the procedure including the risks, benefits and alternatives for the proposed anesthesia with the patient or authorized representative who has indicated his/her understanding and acceptance.   Dental advisory given  Plan Discussed with: CRNA  Anesthesia Plan Comments:         Anesthesia Quick Evaluation

## 2012-02-16 NOTE — Op Note (Signed)
02/16/2012  10:38 AM  PATIENT:  Walter Rocha  46 y.o. male  PRE-OPERATIVE DIAGNOSIS:  Cervical spondylosis with cervical spinal stenosis C5-6 C6-7  POST-OPERATIVE DIAGNOSIS:  Same  PROCEDURE:  1. Decompressive anterior cervical discectomy C5-6 C6-7, 2. Anterior cervical arthrodesis C5-6 C6-7 utilizing peek interbody cages packed with local autograft and morcellized allograft, 3. Anterior cervical plating C5-C7 utilizing Nuvasive plate  SURGEON:  Marikay Alar, MD  ASSISTANTS: Dr. Newell Coral  ANESTHESIA:   General  EBL: 20 ml  Total I/O In: -  Out: 50 [Blood:50]  BLOOD ADMINISTERED:none  DRAINS: None   SPECIMEN:  No Specimen  INDICATION FOR PROCEDURE: This patient has a history of multiple sclerosis and cervical spondylosis. Followup MRI showed worsening spondylosis with stenosis at C5-6 and C6-7. Recommended a 2 level ACDF with plating at C5-6 and C6-7. Patient understood the risks, benefits, and alternatives and potential outcomes and wished to proceed.  PROCEDURE DETAILS: Patient was brought to the operating room placed under general endotracheal anesthesia. Patient was placed in the supine position on the operating room table. The neck was prepped with Duraprep and draped in a sterile fashion.   Three cc of local anesthesia was injected and a transverse incision was made on the right side of the neck.  Dissection was carried down thru the subcutaneous tissue and the platysma was  elevated, opened, and undermined with Metzenbaum scissors.  Dissection was then carried out thru an avascular plane leaving the sternocleidomastoid carotid artery and jugular vein laterally and the trachea and esophagus medially. The ventral aspect of the vertebral column was identified and a localizing x-ray was taken. The C5-6 and C6-7 level was identified. The longus colli muscles were then elevated and the retractor was placed. The annulus was incised and the disc space entered. Discectomy was  performed with micro-curettes and pituitary rongeurs. I then used the high-speed drill to drill the endplates down to the level of the posterior longitudinal ligament. The C6-7 disc was very collapsed, but was drilled to a height of 9 mm. The drill shavings were saved in a mucous trap for later arthrodesis. The operating microscope was draped and brought into the field provided additional magnification, illumination and visualization. Discectomy was continued posteriorly thru the disc space. Posterior longitudinal ligament was opened with a nerve hook, and then removed along with disc herniation and osteophytes, decompressing the spinal canal and thecal sac. Large posterior osteophytes were found at both levels on the left side. These were identified and removed by undercutting the vertebral bodies above and below. We then continued to remove osteophytic overgrowth and disc material decompressing the neural foramina and exiting nerve roots bilaterally. The scope was angled up and down to help decompress and undercut the vertebral bodies. Once the decompression was completed we could pass a nerve hook circumferentially to assure adequate decompression in the midline and in the neural foramina. So by both visualization and palpation we felt we had an adequate decompression of the neural elements. We then measured the height of the intravertebral disc space and selected a 9 millimeter Peek interbody cage packed with autograft and morcellized allograft. It was then gently positioned in the intravertebral disc space and countersunk at C6-7. An 8 mm graft was used at C5-6.  I then used a Nuvasive plate and placed six variable angle screws into the vertebral bodies and locked them into position. The wound was irrigated with bacitracin solution, checked for hemostasis which was established and confirmed. Once meticulous hemostasis was achieved, we  then proceeded with closure. The platysma was closed with interrupted 3-0  undyed Vicryl suture, the subcuticular layer was closed with interrupted 3-0 undyed Vicryl suture. The skin edges were approximated with steristrips. The drapes were removed. A sterile dressing was applied. The patient was then awakened from general anesthesia and transferred to the recovery room in stable condition. At the end of the procedure all sponge, needle and instrument counts were correct.   PLAN OF CARE: Admit for overnight observation  PATIENT DISPOSITION:  PACU - hemodynamically stable.   Delay start of Pharmacological VTE agent (>24hrs) due to surgical blood loss or risk of bleeding:  yes

## 2012-02-16 NOTE — Preoperative (Signed)
Beta Blockers   Reason not to administer Beta Blockers:Not Applicable 

## 2012-02-16 NOTE — Anesthesia Postprocedure Evaluation (Signed)
  Anesthesia Post-op Note  Patient: Walter Rocha  Procedure(s) Performed: Procedure(s) (LRB): ANTERIOR CERVICAL DECOMPRESSION/DISCECTOMY FUSION 2 LEVELS (N/A)  Patient Location: PACU  Anesthesia Type: General  Level of Consciousness: awake  Airway and Oxygen Therapy: Patient Spontanous Breathing  Post-op Pain: mild  Post-op Assessment: Post-op Vital signs reviewed  Post-op Vital Signs: stable  Complications: No apparent anesthesia complications

## 2012-02-16 NOTE — Transfer of Care (Signed)
Immediate Anesthesia Transfer of Care Note  Patient: Walter Rocha  Procedure(s) Performed: Procedure(s) (LRB): ANTERIOR CERVICAL DECOMPRESSION/DISCECTOMY FUSION 2 LEVELS (N/A)  Patient Location: PACU  Anesthesia Type: General  Level of Consciousness: awake, alert  and oriented  Airway & Oxygen Therapy: Patient Spontanous Breathing and Patient connected to nasal cannula oxygen  Post-op Assessment: Report given to PACU RN and Patient moving all extremities X 4  Post vital signs: Reviewed and stable  Complications: No apparent anesthesia complications

## 2012-02-16 NOTE — H&P (Signed)
Subjective:   Patient is a 46 y.o. male admitted for ACDF C5-6, C6-7. The patient first presented to me with complaints of neck pain. Onset of symptoms was a few years ago. The pain is described as aching and occurs intermittently. The pain is rated mild, and is located at the base of the neck without radiation. The symptoms have been progressive. Symptoms are exacerbated by none, and are relieved by none.  Previous work up includes MRI of cervical spine, results: spinal stenosis.  Past Medical History  Diagnosis Date  . Peritonsillar abscess   . Disorders of bilirubin excretion   . Dyspepsia and other specified disorders of function of stomach   . Pure hypercholesterolemia   . Multiple sclerosis   . Hiatal hernia   . GERD (gastroesophageal reflux disease)     Past Surgical History  Procedure Date  . Lasik   . Cervical discectomy     No Known Allergies  History  Substance Use Topics  . Smoking status: Never Smoker   . Smokeless tobacco: Not on file  . Alcohol Use: Yes     every 2-3 weeks    Family History  Problem Relation Age of Onset  . Hyperlipidemia Mother   . Arthritis Mother   . Cancer Father     lung  . Hyperlipidemia Brother   . Cancer Maternal Uncle   . Heart disease Maternal Uncle   . Kidney disease Maternal Uncle   . Ulcerative colitis Maternal Uncle   . Obesity Maternal Uncle   . Cancer Paternal Uncle     skin and lung  . Cancer Maternal Grandmother     kindney  . Cancer Maternal Grandfather     liver  . Cancer Paternal Grandmother   . Cancer Paternal Grandfather     stomach  . Prostate cancer Neg Hx   . Colon cancer Neg Hx    Prior to Admission medications   Medication Sig Start Date End Date Taking? Authorizing Provider  acetaminophen (TYLENOL) 500 MG tablet Take 500 mg by mouth every 6 (six) hours as needed. For pain   Yes Historical Provider, MD  atorvastatin (LIPITOR) 10 MG tablet Take 1 tablet (10 mg total) by mouth daily. 12/31/11  Yes Joaquim Nam, MD  interferon beta-1a (REBIF) 44 MCG/0.5ML injection Inject 44 mcg into the skin 3 (three) times a week. Monday, Wednesday, and Friday   Yes Historical Provider, MD  lansoprazole (PREVACID) 30 MG capsule Take 1 capsule (30 mg total) by mouth daily. 12/31/11  Yes Joaquim Nam, MD  Multiple Vitamin (MULTIVITAMIN) tablet Take 1 tablet by mouth daily.     Yes Historical Provider, MD     Review of Systems  Positive ROS: neg  All other systems have been reviewed and were otherwise negative with the exception of those mentioned in the HPI and as above.  Objective: Vital signs in last 24 hours: Temp:  [97.5 F (36.4 C)] 97.5 F (36.4 C) (07/17 0710) Pulse Rate:  [71] 71  (07/17 0710) Resp:  [18] 18  (07/17 0710) BP: (118)/(77) 118/77 mmHg (07/17 0710) SpO2:  [99 %] 99 % (07/17 0710)  General Appearance: Alert, cooperative, no distress, appears stated age Head: Normocephalic, without obvious abnormality, atraumatic Eyes: PERRL, conjunctiva/corneas clear, EOM's intact, fundi benign, both eyes      Ears: Normal TM's and external ear canals, both ears Throat: Lips, mucosa, and tongue normal; teeth and gums normal Neck: Supple, symmetrical, trachea midline, no adenopathy; thyroid: No  enlargement/tenderness/nodules; no carotid bruit or JVD Back: Symmetric, no curvature, ROM normal, no CVA tenderness Lungs: Clear to auscultation bilaterally, respirations unlabored Heart: Regular rate and rhythm, S1 and S2 normal, no murmur, rub or gallop Abdomen: Soft, non-tender, bowel sounds active all four quadrants, no masses, no organomegaly Extremities: Extremities normal, atraumatic, no cyanosis or edema Pulses: 2+ and symmetric all extremities Skin: Skin color, texture, turgor normal, no rashes or lesions  NEUROLOGIC:  Mental status: Alert and oriented x4, no aphasia, good attention span, fund of knowledge and memory  Motor Exam - grossly normal Sensory Exam - grossly normal Reflexes:  1+ Coordination - grossly normal Gait - grossly normal Balance - grossly normal Cranial Nerves: I: smell Not tested  II: visual acuity  OS: nl    OD: nl  II: visual fields Full to confrontation  II: pupils Equal, round, reactive to light  III,VII: ptosis None  III,IV,VI: extraocular muscles  Full ROM  V: mastication Normal  V: facial light touch sensation  Normal  V,VII: corneal reflex  Present  VII: facial muscle function - upper  Normal  VII: facial muscle function - lower Normal  VIII: hearing Not tested  IX: soft palate elevation  Normal  IX,X: gag reflex Present  XI: trapezius strength  5/5  XI: sternocleidomastoid strength 5/5  XI: neck flexion strength  5/5  XII: tongue strength  Normal    Data Review Lab Results  Component Value Date   WBC 4.7 02/09/2012   HGB 16.6 02/09/2012   HCT 46.2 02/09/2012   MCV 89.5 02/09/2012   PLT 171 02/09/2012   Lab Results  Component Value Date   NA 140 02/09/2012   K 3.9 02/09/2012   CL 102 02/09/2012   CO2 27 02/09/2012   BUN 18 02/09/2012   CREATININE 1.12 02/09/2012   GLUCOSE 86 02/09/2012   Lab Results  Component Value Date   INR 0.98 02/09/2012    Assessment:   Cervical neck pain with herniated nucleus pulposus/ spondylosis/ stenosis at C5-6, C6-7. Patient has failed conservative therapy. Planned surgery : ACDF C5-6, C6-7  Plan:   I explained the condition and procedure to the patient and answered any questions.  Patient wishes to proceed with procedure as planned. Understands risks/ benefits/ and expected or typical outcomes.  Walter Rocha S 02/16/2012 8:10 AM

## 2012-02-16 NOTE — Plan of Care (Signed)
Problem: Consults Goal: Diagnosis - Spinal Surgery Outcome: Completed/Met Date Met:  02/16/12 Cervical Spine Fusion     

## 2012-02-17 MED ORDER — CYCLOBENZAPRINE HCL 10 MG PO TABS: 10.0000 mg | ORAL_TABLET | Freq: Three times a day (TID) | ORAL | Status: AC | PRN

## 2012-02-17 MED ORDER — OXYCODONE-ACETAMINOPHEN 5-325 MG PO TABS: 1.0000 | ORAL_TABLET | Freq: Four times a day (QID) | ORAL | Status: AC | PRN

## 2012-02-17 NOTE — Discharge Summary (Signed)
Physician Discharge Summary  Patient ID: STEELE Rocha MRN: [id number] DOB/AGE: 48-Dec-1967 48 y.o.  Admit date: 02/16/2012 Discharge date: 02/17/2012  Admission Diagnoses: cervical stenosis    Discharge Diagnoses: same   Discharged Condition: good  Hospital Course: The patient was admitted on 02/16/2012 and taken to the operating room where the patient underwent ACDF. The patient tolerated the procedure well and was taken to the recovery room and then to the floor in stable condition. The hospital course was routine. There were no complications. The wound remained clean dry and intact. Pt had appropriate neck soreness. No complaints of arm pain or new N/T/W. The patient remained afebrile with stable vital signs, and tolerated a regular diet. The patient continued to increase activities, and pain was well controlled with oral pain medications.   Consults: None  Significant Diagnostic Studies:  Results for orders placed during the hospital encounter of 02/09/12  SURGICAL PCR SCREEN      Component Value Range   MRSA, PCR NEGATIVE  NEGATIVE   Staphylococcus aureus NEGATIVE  NEGATIVE  CBC WITH DIFFERENTIAL      Component Value Range   WBC 4.7  4.0 - 10.5 K/uL   RBC 5.16  4.22 - 5.81 MIL/uL   Hemoglobin 16.6  13.0 - 17.0 g/dL   HCT 46.9  62.9 - 52.8 %   MCV 89.5  78.0 - 100.0 fL   MCH 32.2  26.0 - 34.0 pg   MCHC 35.9  30.0 - 36.0 g/dL   RDW 41.3  24.4 - 01.0 %   Platelets 171  150 - 400 K/uL   Neutrophils Relative 48  43 - 77 %   Neutro Abs 2.2  1.7 - 7.7 K/uL   Lymphocytes Relative 34  12 - 46 %   Lymphs Abs 1.6  0.7 - 4.0 K/uL   Monocytes Relative 14 (*) 3 - 12 %   Monocytes Absolute 0.7  0.1 - 1.0 K/uL   Eosinophils Relative 4  0 - 5 %   Eosinophils Absolute 0.2  0.0 - 0.7 K/uL   Basophils Relative 0  0 - 1 %   Basophils Absolute 0.0  0.0 - 0.1 K/uL  BASIC METABOLIC PANEL      Component Value Range   Sodium 140  135 - 145 mEq/L   Potassium 3.9  3.5 - 5.1 mEq/L   Chloride 102  96 - 112 mEq/L   CO2 27  19 - 32 mEq/L   Glucose, Bld 86  70 - 99 mg/dL   BUN 18  6 - 23 mg/dL   Creatinine, Ser 2.72  0.50 - 1.35 mg/dL   Calcium 9.8  8.4 - 53.6 mg/dL   GFR calc non Af Amer 76 (*) >90 mL/min   GFR calc Af Amer 88 (*) >90 mL/min  PROTIME-INR      Component Value Range   Prothrombin Time 13.2  11.6 - 15.2 seconds   INR 0.98  0.00 - 1.49    Chest 2 View  02/09/2012  *RADIOLOGY REPORT*  Clinical Data: Preoperative respiratory evaluation prior to two level cervical fusion.  History of hypertension.  CHEST - 2 VIEW  Comparison: None.  Findings: Cardiomediastinal silhouette unremarkable.   Lungs clear. Bronchovascular markings normal.  Pulmonary vascularity normal.  No pneumothorax.  No pleural effusions.  Degenerative changes throughout the thoracic spine.  IMPRESSION: No acute cardiopulmonary disease.  Original Report Authenticated By: Arnell Sieving, M.D.   Dg Cervical Spine 2-3 Views  02/16/2012  *RADIOLOGY  REPORT*  Clinical Data: Cervical fusion C5-C7.  DG C-ARM 1-60 MIN,CERVICAL SPINE - 2-3 VIEW  Comparison: None.  Findings: Two intraoperative spot images demonstrate changes of anterior fusion from C5-C7.  The C6 and C7 vertebral body is difficult to visualize due to overlying shoulders.  No visible complicating feature.  IMPRESSION: C5-C7 ACDF.  Original Report Authenticated By: Cyndie Chime, M.D.   US Abdomen Complete  01/20/2012  *RADIOLOGY REPORT*  Clinical Data:  Elevated liver function tests  COMPLETE ABDOMINAL ULTRASOUND  Comparison:  None.  Findings:  Gallbladder:  The gallbladder is visualized and no gallstones are noted.  There is no pain over the gallbladder with compression.  Common bile duct:  The common bile duct is normal measuring 3.4 mm in diameter.  Liver:  The liver is echogenic consistent with fatty infiltration. The liver also is somewhat prominent measuring 16.9 cm sagittally.  IVC:  Appears normal.  Pancreas:  The pancreas is partially  obscured by bowel gas.  Spleen:  The spleen is normal measuring 9.1 cm sagittally.  Right Kidney:  No hydronephrosis is seen.  The right kidney measures 12.4 cm sagittally.  Left Kidney:  No hydronephrosis is noted.  The left kidney measures 11.9 cm.  Abdominal aorta:  The abdominal aorta is normal in caliber.  IMPRESSION:  1.  Fatty infiltration of the liver with mild hepatomegaly.  No ductal dilatation. 2.  No gallstones. 3.  The pancreas is obscured by bowel gas.  Original Report Authenticated By: Juline Patch, M.D.   Dg C-arm 1-60 Min  02/16/2012  *RADIOLOGY REPORT*  Clinical Data: Cervical fusion C5-C7.  DG C-ARM 1-60 MIN,CERVICAL SPINE - 2-3 VIEW  Comparison: None.  Findings: Two intraoperative spot images demonstrate changes of anterior fusion from C5-C7.  The C6 and C7 vertebral body is difficult to visualize due to overlying shoulders.  No visible complicating feature.  IMPRESSION: C5-C7 ACDF.  Original Report Authenticated By: Cyndie Chime, M.D.    Antibiotics:  Anti-infectives     Start     Dose/Rate Route Frequency Ordered Stop   02/16/12 1300   ceFAZolin (ANCEF) IVPB 1 g/50 mL premix        1 g 100 mL/hr over 30 Minutes Intravenous Every 8 hours 02/16/12 1211 02/16/12 2135   02/16/12 0917   bacitracin 50,000 Units in sodium chloride irrigation 0.9 % 500 mL irrigation  Status:  Discontinued          As needed 02/16/12 0917 02/16/12 1043   02/16/12 0802   bacitracin [address] UNITS injection     Comments: DAY, DORY: cabinet override         02/16/12 0802 02/16/12 2014   02/16/12 0000   ceFAZolin (ANCEF) IVPB 2 g/50 mL premix  Status:  Discontinued        2 g 100 mL/hr over 30 Minutes Intravenous 60 min pre-op 02/15/12 1507 02/16/12 1210          Discharge Exam: Blood pressure 108/69, pulse 97, temperature 98.8 F (37.1 C), temperature source Oral, resp. rate 16, SpO2 95.00%. Neurologic: Grossly normal Incision CDI  Discharge Medications:   Medication List  As of  02/17/2012  7:43 AM   TAKE these medications         acetaminophen 500 MG tablet   Commonly known as: TYLENOL   Take 500 mg by mouth every 6 (six) hours as needed. For pain      atorvastatin 10 MG tablet   Commonly known as: LIPITOR  Take 1 tablet (10 mg total) by mouth daily.      cyclobenzaprine 10 MG tablet   Commonly known as: FLEXERIL   Take 1 tablet (10 mg total) by mouth 3 (three) times daily as needed for muscle spasms.      lansoprazole 30 MG capsule   Commonly known as: PREVACID   Take 1 capsule (30 mg total) by mouth daily.      multivitamin tablet   Take 1 tablet by mouth daily.      oxyCODONE-acetaminophen 5-325 MG per tablet   Commonly known as: PERCOCET/ROXICET   Take 1-2 tablets by mouth every 6 (six) hours as needed for pain.      REBIF 44 MCG/0.5ML injection   Generic drug: interferon beta-1a   Inject 44 mcg into the skin 3 (three) times a week. Monday, Wednesday, and Friday            Disposition: home   Final Dx: ACDF  Discharge Orders    Future Orders Please Complete By Expires   Diet - low sodium heart healthy      Increase activity slowly      Driving Restrictions      Comments:   1 week   Lifting restrictions      Comments:   Less than 10 lbs   Remove dressing in 24 hours      Call MD for:  temperature >100.4      Call MD for:  persistant nausea and vomiting      Call MD for:  severe uncontrolled pain      Call MD for:  redness, tenderness, or signs of infection (pain, swelling, redness, odor or green/yellow discharge around incision site)      Call MD for:  difficulty breathing, headache or visual disturbances         Follow-up Information    Follow up with Ellyce Lafevers S, MD. Schedule an appointment as soon as possible for a visit in 2 weeks.   Contact information:   [address] [place] [address] [phone]           Signed: Tia Alert 02/17/2012, 7:43 AM

## 2012-02-18 ENCOUNTER — Encounter (HOSPITAL_COMMUNITY): Payer: Self-pay | Admitting: Neurological Surgery

## 2012-07-02 IMAGING — US US ABDOMEN COMPLETE
1 series · 14 of 25 positions shown · non-contrast
Comparison: None.

CLINICAL DATA: Elevated liver function tests

COMPLETE ABDOMINAL ULTRASOUND

[Series 1: us abdomen complete · 0.35mm/px · 14 of 70 slices shown]
[im 1/70]
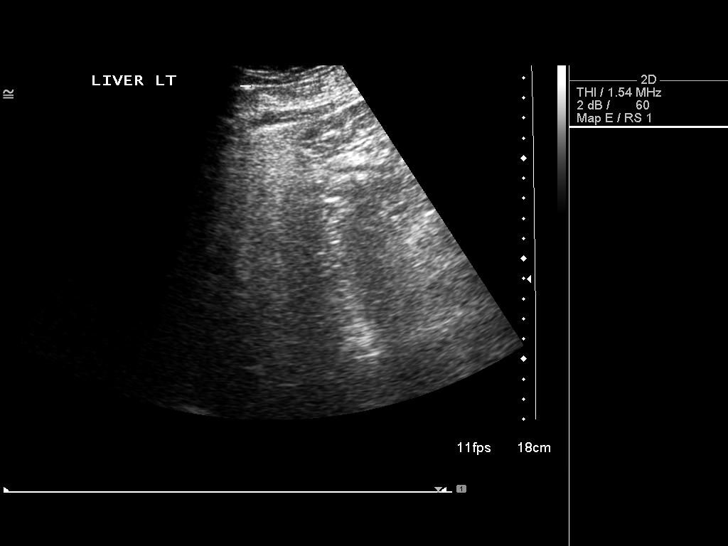
[im 6/70]
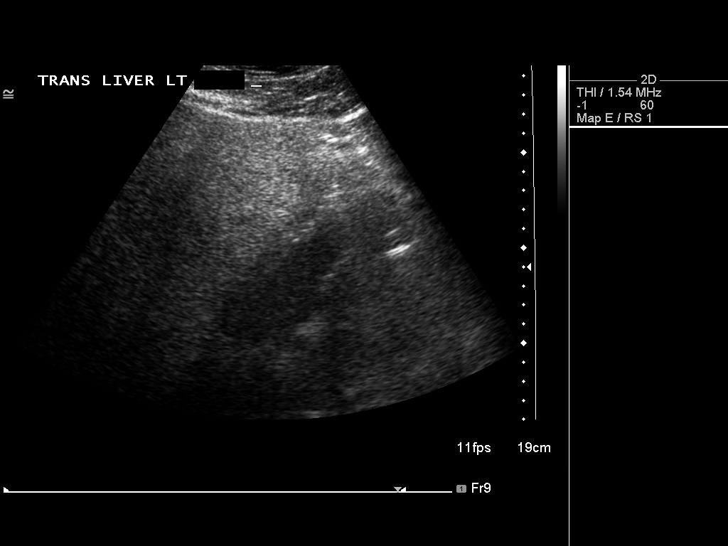
[im 12/70]
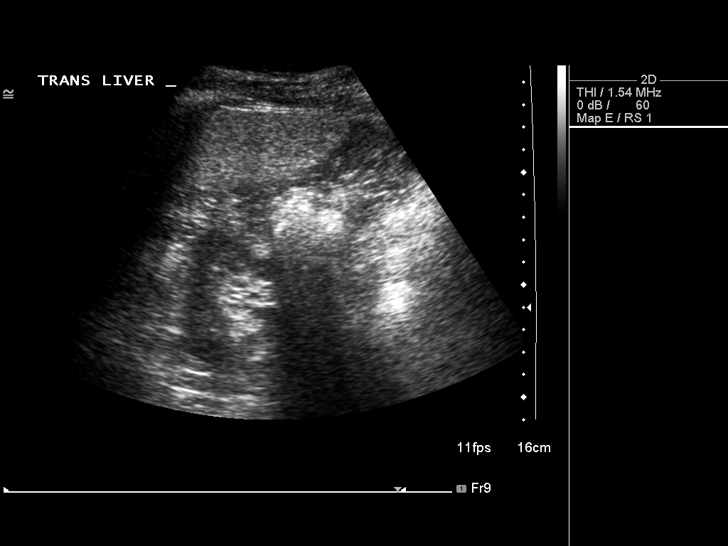
[im 18/70]
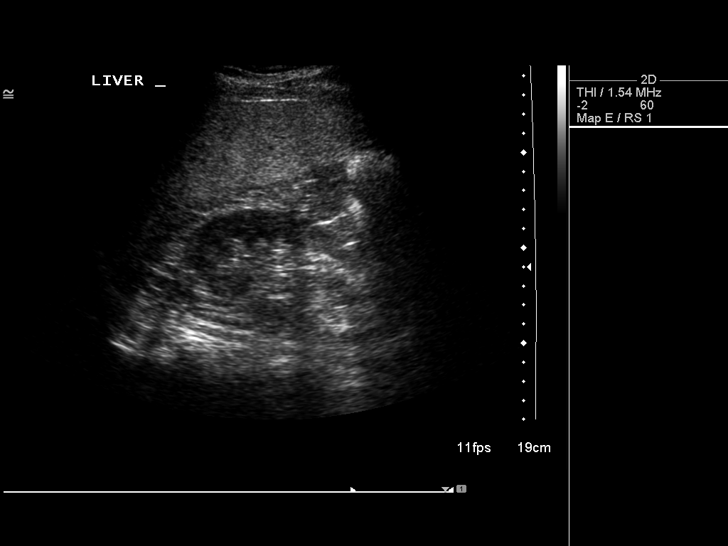
[im 24/70]
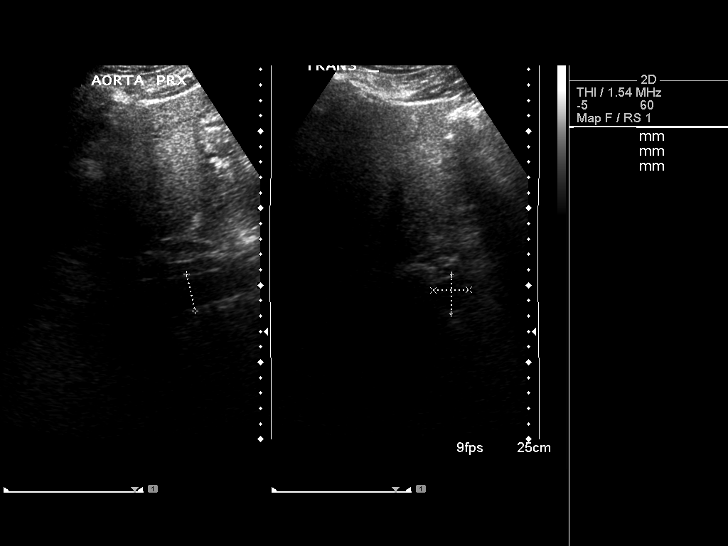
[im 26/70]
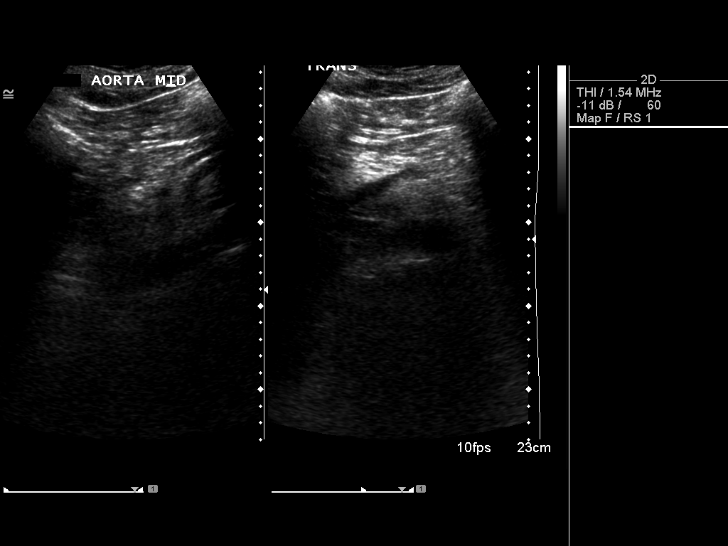
[im 32/70]
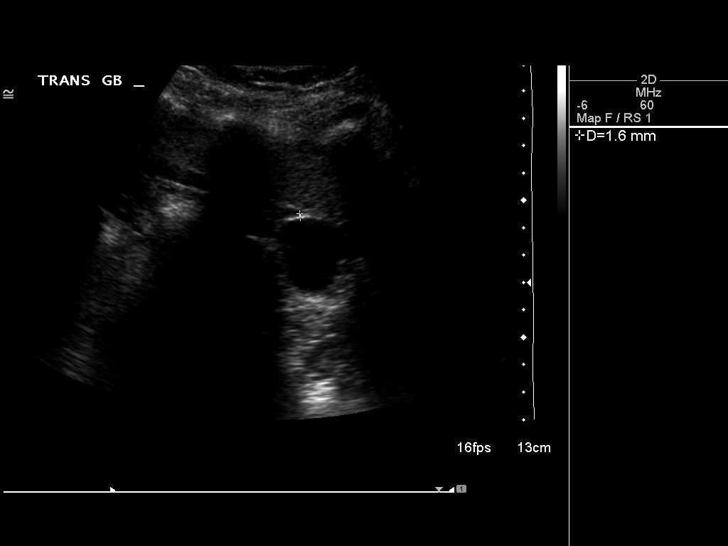
[im 38/70]
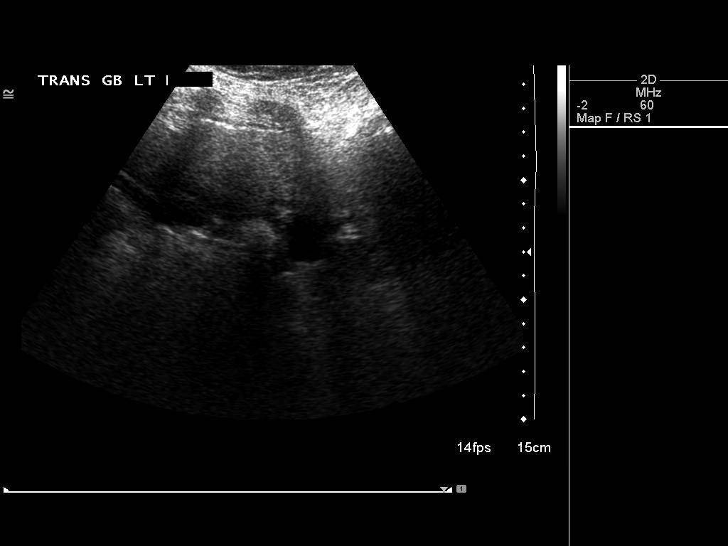
[im 44/70]
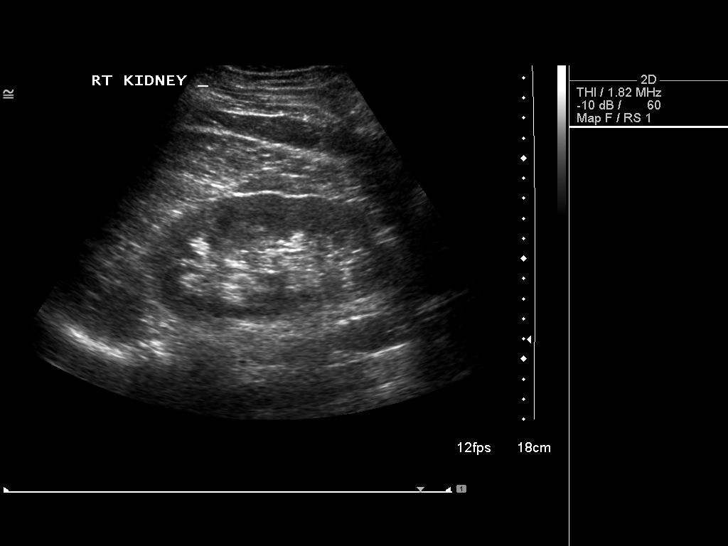
[im 47/70]
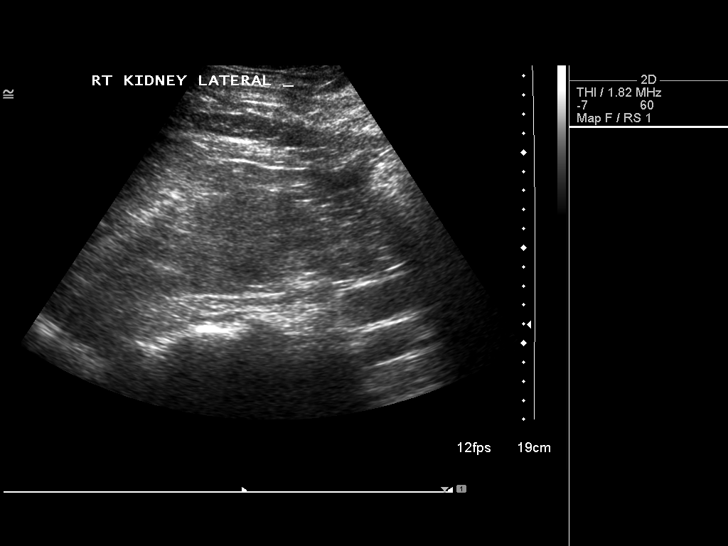
[im 52/70]
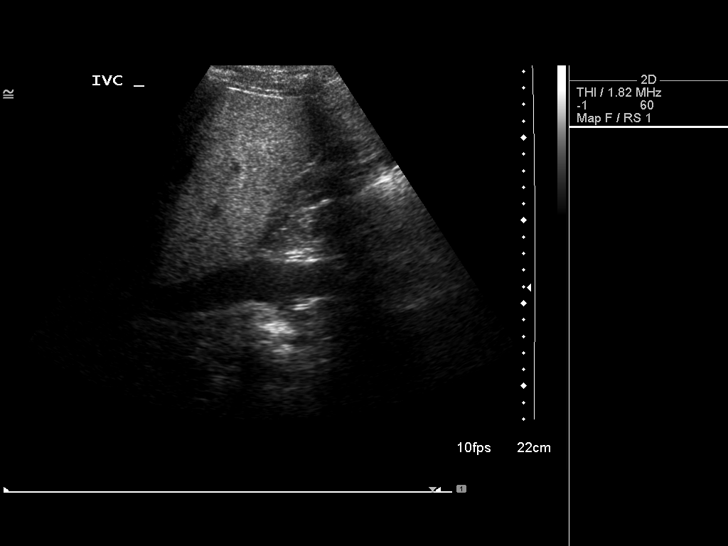
[im 58/70]
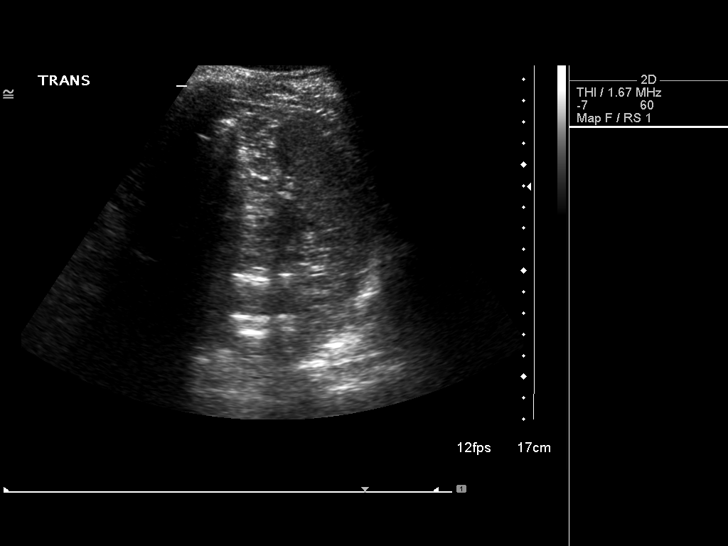
[im 64/70]
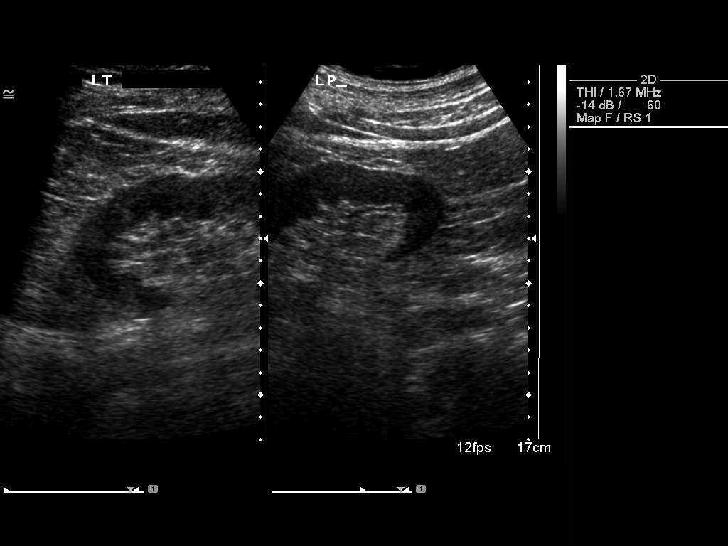
[im 70/70]
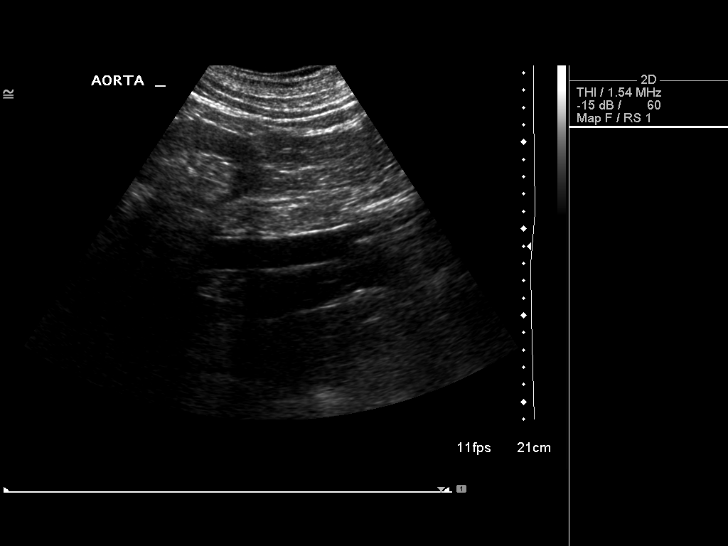

[14 of 25 positions shown; findings below may reference images not displayed]

FINDINGS: Gallbladder:  The gallbladder is visualized and no gallstones are
noted.  There is no pain over the gallbladder with compression.

Common bile duct:  The common bile duct is normal measuring 3.4 mm
in diameter.

Liver:  The liver is echogenic consistent with fatty infiltration.
The liver also is somewhat prominent measuring 16.9 cm sagittally.

IVC:  Appears normal.

Pancreas:  The pancreas is partially obscured by bowel gas.

Spleen:  The spleen is normal measuring 9.1 cm sagittally.

Right Kidney:  No hydronephrosis is seen.  The right kidney
measures 12.4 cm sagittally.

Left Kidney:  No hydronephrosis is noted.  The left kidney measures
11.9 cm.

Abdominal aorta:  The abdominal aorta is normal in caliber.
IMPRESSION: 1.  Fatty infiltration of the liver with mild hepatomegaly.  No
ductal dilatation.
2.  No gallstones.
3.  The pancreas is obscured by bowel gas.

## 2012-07-22 IMAGING — CR DG CHEST 2V
2 series · 2 of 2 positions shown · non-contrast
Comparison: None.

CLINICAL DATA: Preoperative respiratory evaluation prior to two
level cervical fusion.  History of hypertension.

CHEST - 2 VIEW

[view not recorded (1 of 2)]
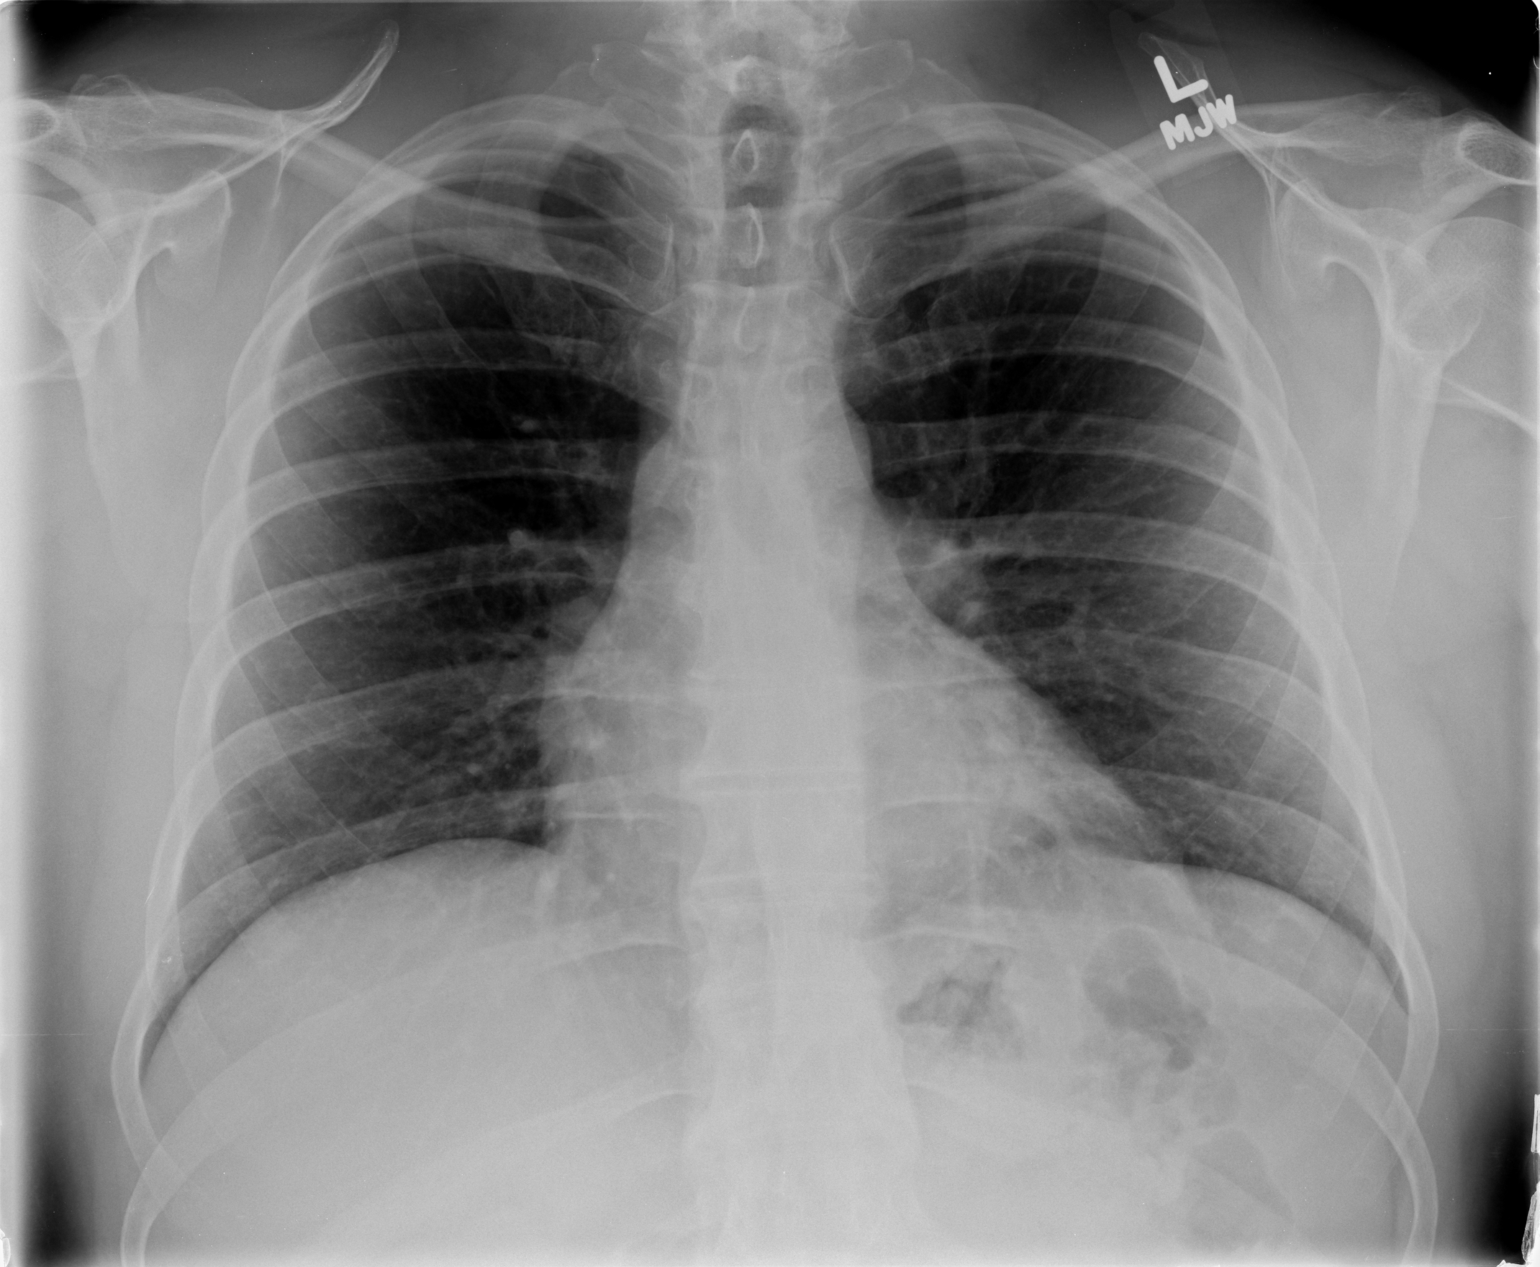

[view not recorded (2 of 2)]
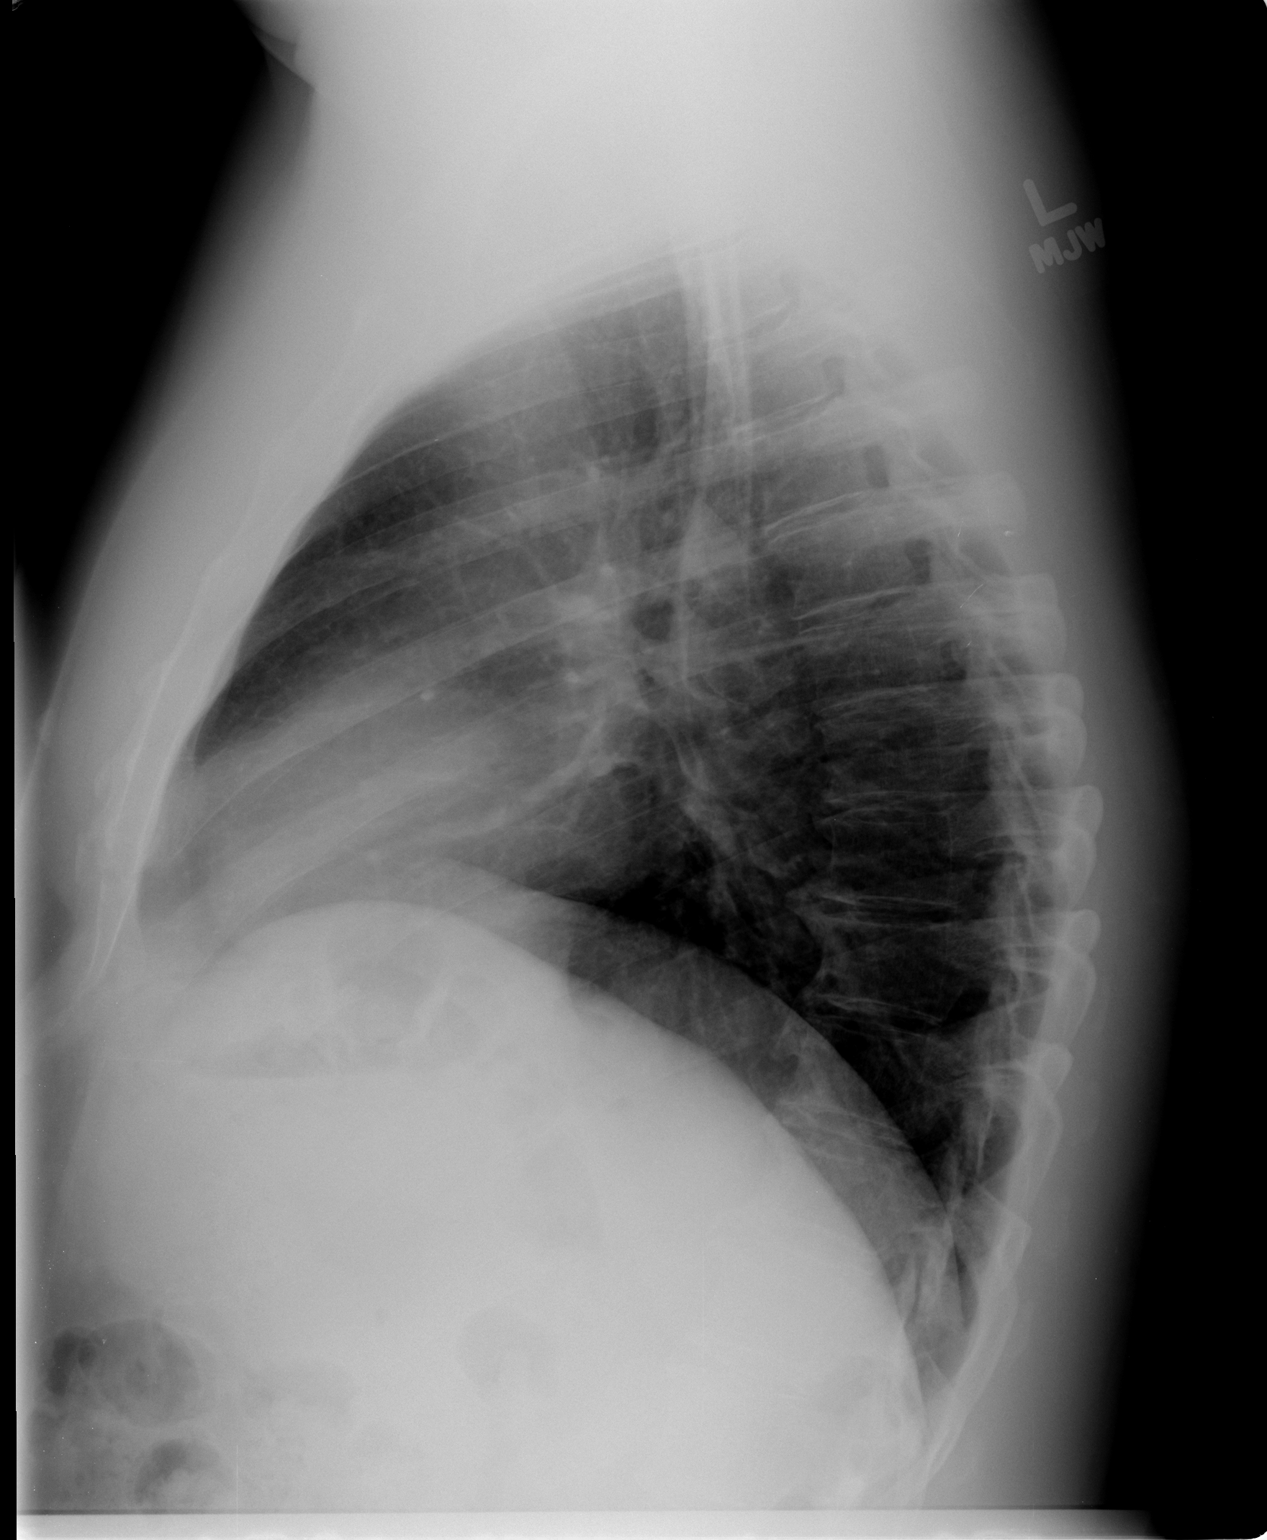

[2 of 2 positions shown; findings below may reference images not displayed]

FINDINGS: Cardiomediastinal silhouette unremarkable.   Lungs clear.
Bronchovascular markings normal.  Pulmonary vascularity normal.  No
pneumothorax.  No pleural effusions.  Degenerative changes
throughout the thoracic spine.
IMPRESSION: No acute cardiopulmonary disease.

## 2012-07-29 IMAGING — RF DG CERVICAL SPINE 2 OR 3 VIEWS
1 series · 2 of 2 positions shown · non-contrast
Comparison: None.

CLINICAL DATA: Cervical fusion C5-C7.

DG C-ARM 1-60 MIN,CERVICAL SPINE - 2-3 VIEW

[Series 1: run · 2 of 2 slices shown]
[im 1/2]
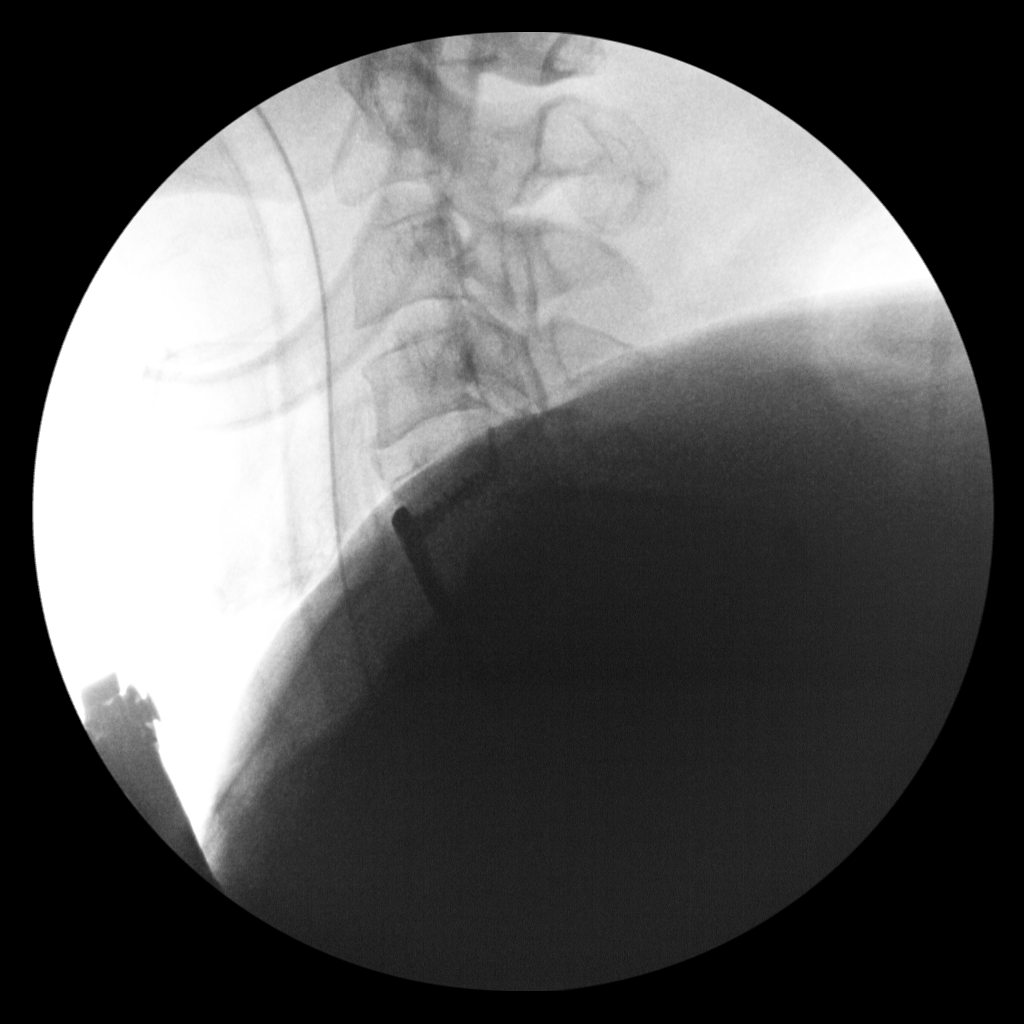
[im 2/2]
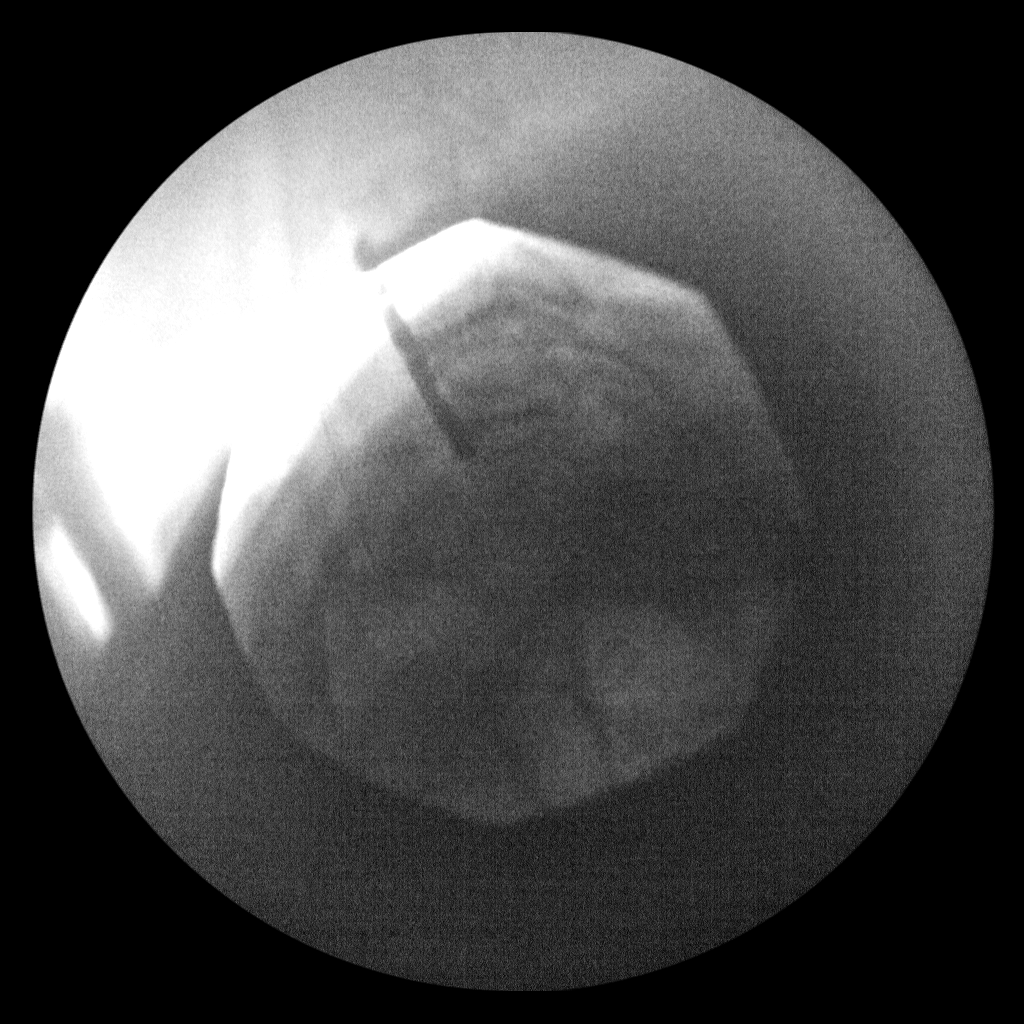

[2 of 2 positions shown; findings below may reference images not displayed]

FINDINGS: Two intraoperative spot images demonstrate changes of
anterior fusion from C5-C7.  The C6 and C7 vertebral body is
difficult to visualize due to overlying shoulders.  No visible
complicating feature.
IMPRESSION: C5-C7 ACDF.

## 2013-01-02 ENCOUNTER — Other Ambulatory Visit: Payer: Self-pay

## 2013-01-02 DIAGNOSIS — R1013 Epigastric pain: Secondary | ICD-10-CM

## 2013-01-02 MED ORDER — LANSOPRAZOLE 30 MG PO CPDR: 30.0000 mg | DELAYED_RELEASE_CAPSULE | Freq: Every day | ORAL | Status: DC

## 2013-01-02 MED ORDER — ATORVASTATIN CALCIUM 10 MG PO TABS: 10.0000 mg | ORAL_TABLET | Freq: Every day | ORAL | Status: DC

## 2013-01-02 NOTE — Telephone Encounter (Signed)
Pt left vm requesting refill on lipitor and prevacid # 30 each to [facility] [id number] [place], [place]> pt notified by v/m done and to keep CPX on 01/25/13.

## 2013-01-25 ENCOUNTER — Ambulatory Visit (INDEPENDENT_AMBULATORY_CARE_PROVIDER_SITE_OTHER): Payer: Federal, State, Local not specified - PPO | Admitting: Family Medicine

## 2013-01-25 ENCOUNTER — Encounter: Payer: Self-pay | Admitting: Family Medicine

## 2013-01-25 VITALS — BP 122/78 | HR 72 | Temp 98.0°F | Ht 71.0 in | Wt 235.5 lb

## 2013-01-25 DIAGNOSIS — G35 Multiple sclerosis: Secondary | ICD-10-CM

## 2013-01-25 DIAGNOSIS — E7800 Pure hypercholesterolemia, unspecified: Secondary | ICD-10-CM

## 2013-01-25 DIAGNOSIS — R1013 Epigastric pain: Secondary | ICD-10-CM

## 2013-01-25 DIAGNOSIS — Z0000 Encounter for general adult medical examination without abnormal findings: Secondary | ICD-10-CM

## 2013-01-25 DIAGNOSIS — K3189 Other diseases of stomach and duodenum: Secondary | ICD-10-CM

## 2013-01-25 LAB — COMPREHENSIVE METABOLIC PANEL
ALT: 126 U/L — ABNORMAL HIGH (ref 0–53)
AST: 71 U/L — ABNORMAL HIGH (ref 0–37)
Calcium: 9.6 mg/dL (ref 8.4–10.5)
Chloride: 103 mEq/L (ref 96–112)
Creatinine, Ser: 1.1 mg/dL (ref 0.4–1.5)
Total Bilirubin: 2.6 mg/dL — ABNORMAL HIGH (ref 0.3–1.2)

## 2013-01-25 LAB — LDL CHOLESTEROL, DIRECT: Direct LDL: 166.1 mg/dL

## 2013-01-25 LAB — LIPID PANEL
HDL: 58.3 mg/dL (ref >39.00)
VLDL: 27.4 mg/dL (ref 0.0–40.0)

## 2013-01-25 MED ORDER — LANSOPRAZOLE 30 MG PO CPDR: 30.0000 mg | DELAYED_RELEASE_CAPSULE | Freq: Every day | ORAL | Status: DC

## 2013-01-25 MED ORDER — ATORVASTATIN CALCIUM 10 MG PO TABS: 10.0000 mg | ORAL_TABLET | Freq: Every day | ORAL | Status: DC

## 2013-01-25 NOTE — Patient Instructions (Addendum)
Go to the lab on the way out.  We'll contact you with your lab report. Take care.  Glad to see you.  

## 2013-01-25 NOTE — Progress Notes (Signed)
CPE- See plan.  Routine anticipatory guidance given to patient.  See health maintenance. Tetanus 2008 Flu shot yearly.  PSA and colon cancer screening discussed.  Neither indicated now.  Diet and exercise d/w pt. He ran a 5K this month.  Encouraged on both.   Living will d/w pt.  Has a living will.  Wife is designated if incapacitated.    MS.  No flares.  Followed by neuro.    H/o transaminitis and fatty liver prev documented.   Elevated Cholesterol: Using medications without problems: yes Muscle aches: no Diet compliance: yes Exercise:yes  PMH and SH reviewed  Meds, vitals, and allergies reviewed.   ROS: See HPI.  Otherwise negative.    GEN: nad, alert and oriented HEENT: mucous membranes moist NECK: supple w/o LA CV: rrr. PULM: ctab, no inc wob ABD: soft, +bs EXT: no edema SKIN: no acute rash

## 2013-01-26 ENCOUNTER — Other Ambulatory Visit: Payer: Self-pay | Admitting: Family Medicine

## 2013-01-26 DIAGNOSIS — E7800 Pure hypercholesterolemia, unspecified: Secondary | ICD-10-CM

## 2013-01-26 NOTE — Assessment & Plan Note (Signed)
Routine anticipatory guidance given to patient.  See health maintenance. Tetanus 2008 Flu shot yearly.  PSA and colon cancer screening discussed.  Neither indicated now.  Diet and exercise d/w pt. He ran a 5K this month.  Encouraged on both.   Living will d/w pt.  Has a living will.  Wife is designated if incapacitated.

## 2013-01-26 NOTE — Assessment & Plan Note (Signed)
Per neuro 

## 2013-01-26 NOTE — Assessment & Plan Note (Signed)
D/w pt about diet and exercise.  See notes on labs.  

## 2013-04-11 ENCOUNTER — Ambulatory Visit (INDEPENDENT_AMBULATORY_CARE_PROVIDER_SITE_OTHER): Payer: Federal, State, Local not specified - PPO | Admitting: Family Medicine

## 2013-04-11 ENCOUNTER — Encounter: Payer: Self-pay | Admitting: Family Medicine

## 2013-04-11 VITALS — BP 120/76 | HR 72 | Temp 98.3°F | Wt 238.8 lb

## 2013-04-11 DIAGNOSIS — Z4802 Encounter for removal of sutures: Secondary | ICD-10-CM | POA: Insufficient documentation

## 2013-04-11 NOTE — Assessment & Plan Note (Signed)
11 removed, no complications, good tissue healing. Covered with steristrips with typical instructions.   F/u prn.

## 2013-04-11 NOTE — Progress Notes (Signed)
Recently in ER for laceration on 04/01/13.  Broken pyrex pot, placed in the trash.  It cut his leg when he picked up the bag.  Placed on keflex and given tetanus in ER.  Staples placed in ER.  Here for removal.   Meds, vitals, and allergies reviewed.   ROS: See HPI.  Otherwise, noncontributory.  nad L shin with 11 staples noted, good tissue healing.   All removed, tolerated well, no dehiscence of tissue.

## 2013-04-11 NOTE — Patient Instructions (Addendum)
Let the strips wear off.  Call if you have questions.

## 2013-04-30 ENCOUNTER — Other Ambulatory Visit (INDEPENDENT_AMBULATORY_CARE_PROVIDER_SITE_OTHER): Payer: Federal, State, Local not specified - PPO

## 2013-04-30 DIAGNOSIS — E7800 Pure hypercholesterolemia, unspecified: Secondary | ICD-10-CM

## 2013-04-30 DIAGNOSIS — K760 Fatty (change of) liver, not elsewhere classified: Secondary | ICD-10-CM

## 2013-04-30 DIAGNOSIS — K7689 Other specified diseases of liver: Secondary | ICD-10-CM

## 2013-04-30 LAB — HEPATIC FUNCTION PANEL
ALT: 85 U/L — ABNORMAL HIGH (ref 0–53)
AST: 43 U/L — ABNORMAL HIGH (ref 0–37)
Albumin: 4.4 g/dL (ref 3.5–5.2)
Alkaline Phosphatase: 56 U/L (ref 39–117)
Total Protein: 7.2 g/dL (ref 6.0–8.3)

## 2013-04-30 LAB — LIPID PANEL: Cholesterol: 203 mg/dL — ABNORMAL HIGH (ref 0–200)

## 2013-05-01 ENCOUNTER — Encounter: Payer: Self-pay | Admitting: *Deleted

## 2013-05-09 ENCOUNTER — Telehealth: Payer: Self-pay

## 2013-05-09 NOTE — Telephone Encounter (Signed)
Pt request 01/25/13 glucose 89 and 04/30/13 chol 203, trig 190.0, HDL 53.20, Chol/HDL ratio 4, LDL 124.2. Pt notified as instructed from lab results.

## 2013-05-21 ENCOUNTER — Telehealth: Payer: Self-pay

## 2013-05-21 NOTE — Telephone Encounter (Signed)
Form is done, thanks.

## 2013-05-21 NOTE — Telephone Encounter (Signed)
Have you seen this form?  I don't think I have.

## 2013-05-21 NOTE — Telephone Encounter (Signed)
Form faxed to [employer].

## 2013-05-21 NOTE — Telephone Encounter (Signed)
Pt left v/m; pt faxed health improvement program form for pt's employer to [phone] to Dr Lianne Bushy attention; form allows pt to participate in regular exercise program. Pt request cb to verify form received and status of form.Please advise.

## 2013-06-07 ENCOUNTER — Other Ambulatory Visit: Payer: Self-pay

## 2014-01-22 ENCOUNTER — Other Ambulatory Visit: Payer: Self-pay

## 2014-01-22 DIAGNOSIS — K3189 Other diseases of stomach and duodenum: Secondary | ICD-10-CM

## 2014-01-22 DIAGNOSIS — R1013 Epigastric pain: Principal | ICD-10-CM

## 2014-01-22 MED ORDER — LANSOPRAZOLE 30 MG PO CPDR: 30.0000 mg | DELAYED_RELEASE_CAPSULE | Freq: Every day | ORAL | Status: DC

## 2014-01-22 MED ORDER — ATORVASTATIN CALCIUM 10 MG PO TABS: 10.0000 mg | ORAL_TABLET | Freq: Every day | ORAL | Status: DC

## 2014-01-22 NOTE — Telephone Encounter (Signed)
Pt working out of town and already has CPX scheduled 04/26/14 and pt request refill atorvastatin and lansoprazole to [facility] MD. Advised done.

## 2014-04-26 ENCOUNTER — Encounter: Payer: Self-pay | Admitting: Family Medicine

## 2014-04-26 ENCOUNTER — Ambulatory Visit (INDEPENDENT_AMBULATORY_CARE_PROVIDER_SITE_OTHER): Payer: Federal, State, Local not specified - PPO | Admitting: Family Medicine

## 2014-04-26 ENCOUNTER — Encounter: Payer: Self-pay | Admitting: Internal Medicine

## 2014-04-26 VITALS — BP 124/84 | HR 78 | Temp 97.4°F | Ht 71.0 in | Wt 233.5 lb

## 2014-04-26 DIAGNOSIS — M25571 Pain in right ankle and joints of right foot: Secondary | ICD-10-CM

## 2014-04-26 DIAGNOSIS — Z1211 Encounter for screening for malignant neoplasm of colon: Secondary | ICD-10-CM

## 2014-04-26 DIAGNOSIS — M25579 Pain in unspecified ankle and joints of unspecified foot: Secondary | ICD-10-CM

## 2014-04-26 DIAGNOSIS — R1013 Epigastric pain: Secondary | ICD-10-CM

## 2014-04-26 DIAGNOSIS — Z0000 Encounter for general adult medical examination without abnormal findings: Secondary | ICD-10-CM

## 2014-04-26 DIAGNOSIS — G35 Multiple sclerosis: Secondary | ICD-10-CM

## 2014-04-26 DIAGNOSIS — Z7189 Other specified counseling: Secondary | ICD-10-CM

## 2014-04-26 DIAGNOSIS — E7800 Pure hypercholesterolemia, unspecified: Secondary | ICD-10-CM

## 2014-04-26 DIAGNOSIS — K3189 Other diseases of stomach and duodenum: Secondary | ICD-10-CM

## 2014-04-26 LAB — COMPREHENSIVE METABOLIC PANEL
ALBUMIN: 4.5 g/dL (ref 3.5–5.2)
ALT: 151 U/L — AB (ref 0–53)
AST: 74 U/L — ABNORMAL HIGH (ref 0–37)
Alkaline Phosphatase: 68 U/L (ref 39–117)
BILIRUBIN TOTAL: 2 mg/dL — AB (ref 0.2–1.2)
BUN: 19 mg/dL (ref 6–23)
CALCIUM: 9.5 mg/dL (ref 8.4–10.5)
CHLORIDE: 105 meq/L (ref 96–112)
CO2: 24 meq/L (ref 19–32)
Creatinine, Ser: 1.1 mg/dL (ref 0.4–1.5)
GFR: 78.5 mL/min (ref >60.00)
GLUCOSE: 91 mg/dL (ref 70–99)
POTASSIUM: 4.4 meq/L (ref 3.5–5.1)
SODIUM: 137 meq/L (ref 135–145)
TOTAL PROTEIN: 7.8 g/dL (ref 6.0–8.3)

## 2014-04-26 LAB — LIPID PANEL
CHOLESTEROL: 213 mg/dL — AB (ref 0–200)
HDL: 49.9 mg/dL (ref >39.00)
LDL Cholesterol: 133 mg/dL — ABNORMAL HIGH (ref 0–99)
NONHDL: 163.1
Total CHOL/HDL Ratio: 4
Triglycerides: 149 mg/dL (ref 0.0–149.0)
VLDL: 29.8 mg/dL (ref 0.0–40.0)

## 2014-04-26 MED ORDER — LANSOPRAZOLE 30 MG PO CPDR: 30.0000 mg | DELAYED_RELEASE_CAPSULE | Freq: Every day | ORAL | Status: DC

## 2014-04-26 MED ORDER — ATORVASTATIN CALCIUM 10 MG PO TABS: 10.0000 mg | ORAL_TABLET | Freq: Every day | ORAL | Status: DC

## 2014-04-26 NOTE — Patient Instructions (Addendum)
Walter Rocha will call about your referral. Go to the lab on the way out.  We'll contact you with your lab report. Try soft inserts for your foot.  Gradually work back on the treadmill.  Get a flu shot at work.

## 2014-04-26 NOTE — Progress Notes (Signed)
Pre visit review using our clinic review tool, if applicable. No additional management support is needed unless otherwise documented below in the visit note.  CPE- See plan.  Routine anticipatory guidance given to patient.  See health maintenance. Tetanus 2014 Flu shot at work, upcoming.  PNA and shingles shot not due.   Prostate cancer screening and PSA options (with potential risks and benefits of testing vs not testing) were discussed along with recent recs/guidelines.  He declined testing PSA at this point. D/w patient Walter Rocha for colon cancer screening, including IFOB vs. colonoscopy.  Risks and benefits of both were discussed and patient voiced understanding.  Pt elects for: colonoscopy.  Living will d/w pt.  Wife designated if patient were incapacitated.   Diet and exercise d/w pt.  Doing better with exercise than diet.  Living on his own for work during the week, working in [place].  Has a routine work schedule.  Discussed.    Elevated Cholesterol: Using medications without problems:yes Muscle aches: no Diet compliance:see above, "food is one of my vices."  Exercise: yes  R lateral foot pain after walking on the treadmill, not during exercise.  No pop or snap, no injury.  Some better now.  Happened about 2 weeks ago.   MS.  Has fu MRI pending.  He had some scalp sensation changes, only on the R side, intermittent. That is the only new sx.  Still with chronic R foot sensation changes.    PMH and SH reviewed  Meds, vitals, and allergies reviewed.   ROS: See HPI.  Otherwise negative.    GEN: nad, alert and oriented HEENT: mucous membranes moist NECK: supple w/o LA CV: rrr. PULM: ctab, no inc wob ABD: soft, +bs EXT: no edema SKIN: no acute rash R foot with normal inspection.  He has some pain near the R 5th MT but isn't ttp now. No other tenderness, not bruised or puffy. No rash. Normal DP pulse.

## 2014-04-28 DIAGNOSIS — Z7189 Other specified counseling: Secondary | ICD-10-CM | POA: Insufficient documentation

## 2014-04-28 DIAGNOSIS — M25579 Pain in unspecified ankle and joints of unspecified foot: Secondary | ICD-10-CM | POA: Insufficient documentation

## 2014-04-28 NOTE — Assessment & Plan Note (Signed)
Would continue statin, LFTs similar to prev.  D/w pt about diet and weight loss.  Labs d/w pt.

## 2014-04-28 NOTE — Assessment & Plan Note (Signed)
Likely a mild strain, should resolve, unremarkable exam today, gradually return to exercise.  He agrees. No need to image today since he isn't tender.

## 2014-04-28 NOTE — Assessment & Plan Note (Signed)
Per neuro 

## 2014-04-28 NOTE — Assessment & Plan Note (Signed)
Routine anticipatory guidance given to patient.  See health maintenance. Tetanus 2014 Flu shot at work, upcoming.  PNA and shingles shot not due.   Prostate cancer screening and PSA options (with potential risks and benefits of testing vs not testing) were discussed along with recent recs/guidelines.  He declined testing PSA at this point. D/w patient BT:[id number] for colon cancer screening, including IFOB vs. colonoscopy.  Risks and benefits of both were discussed and patient voiced understanding.  Pt elects for: colonoscopy.  Living will d/w pt.  Wife designated if patient were incapacitated.   Diet and exercise d/w pt.  Doing better with exercise than diet.  Living on his own for work during the week, working in [place].  Has a routine work schedule.  Discussed.

## 2014-06-26 ENCOUNTER — Ambulatory Visit (AMBULATORY_SURGERY_CENTER): Payer: Self-pay | Admitting: *Deleted

## 2014-06-26 VITALS — Ht 72.0 in | Wt 239.8 lb

## 2014-06-26 DIAGNOSIS — Z1211 Encounter for screening for malignant neoplasm of colon: Secondary | ICD-10-CM

## 2014-06-26 MED ORDER — MOVIPREP 100 G PO SOLR: 1.0000 | Freq: Once | ORAL | Status: DC

## 2014-06-26 NOTE — Progress Notes (Signed)
No egg or soy allergy. He avoids soy but no allergy. ewm No isues with past sedation. ewm No home 02 use. ewm No blood thinners, no diet pills. ewm Pt declined emmi. ewm

## 2014-07-10 ENCOUNTER — Encounter: Payer: Self-pay | Admitting: Internal Medicine

## 2014-07-22 ENCOUNTER — Ambulatory Visit (AMBULATORY_SURGERY_CENTER): Payer: Federal, State, Local not specified - PPO | Admitting: Internal Medicine

## 2014-07-22 ENCOUNTER — Telehealth: Payer: Self-pay

## 2014-07-22 ENCOUNTER — Encounter: Payer: Self-pay | Admitting: Internal Medicine

## 2014-07-22 VITALS — BP 107/75 | HR 67 | Temp 98.9°F | Resp 16 | Ht 72.0 in | Wt 239.0 lb

## 2014-07-22 DIAGNOSIS — Z1211 Encounter for screening for malignant neoplasm of colon: Secondary | ICD-10-CM

## 2014-07-22 DIAGNOSIS — D123 Benign neoplasm of transverse colon: Secondary | ICD-10-CM

## 2014-07-22 DIAGNOSIS — K635 Polyp of colon: Secondary | ICD-10-CM

## 2014-07-22 MED ORDER — SODIUM CHLORIDE 0.9 % IV SOLN: 500.0000 mL | INTRAVENOUS | Status: DC

## 2014-07-22 NOTE — Progress Notes (Signed)
Report to PACU, RN, vss, BBS= Clear.  

## 2014-07-22 NOTE — Progress Notes (Signed)
Pt. Noted to have fine rash covering bilateral arms and legs as well as chest area.  He denies itching.  States he sometimes breaks out as today.

## 2014-07-22 NOTE — Progress Notes (Signed)
Called to room to assist during endoscopic procedure.  Patient ID and intended procedure confirmed with present staff. Received instructions for my participation in the procedure from the performing physician.  

## 2014-07-22 NOTE — Op Note (Signed)
[place]  [address]. [place], [address]   COLONOSCOPY PROCEDURE REPORT  PATIENT: Walter, Rocha  MR#: [id number] BIRTHDATE: 1965/09/26 , 48  yrs. old GENDER: male ENDOSCOPIST: Eustace Quail, MD REFERRED [id number] Duncan, M.D. PROCEDURE DATE:  07/22/2014 PROCEDURE:   Colonoscopy with snare polypectomy x 1 First Screening Colonoscopy - Avg.  risk and is 50 yrs.  old or older Yes.  Prior Negative Screening - Now for repeat screening. N/A  History of Adenoma - Now for follow-up colonoscopy & has been > or = to 3 yrs.  N/A  Polyps Removed Today? Yes. ASA CLASS:   Class II INDICATIONS:average risk for colorectal cancer. MEDICATIONS: Monitored anesthesia care and Propofol 300 mg IV  DESCRIPTION OF PROCEDURE:   After the risks benefits and alternatives of the procedure were thoroughly explained, informed consent was obtained.  The digital rectal exam revealed no abnormalities of the rectum.   The [id number]  endoscope was introduced through the anus and advanced to the cecum, which was identified by both the appendix and ileocecal valve. No adverse events experienced.   The quality of the prep was good, using MoviPrep  The instrument was then slowly withdrawn as the colon was fully examined.    COLON FINDINGS: A single polyp measuring 4 mm in size was found in the transverse colon.  A polypectomy was performed with a cold snare.  The resection was complete, the polyp tissue was completely retrieved and sent to histology.   The examination was otherwise normal.  Retroflexed views revealed internal hemorrhoids. The time to cecum=2 minutes 02 seconds.  Withdrawal time=12 minutes 41 seconds.  The scope was withdrawn and the procedure completed.  COMPLICATIONS: There were no immediate complications.  ENDOSCOPIC IMPRESSION: 1.   Single polyp measuring 4 mm in size was found in the transverse colon; polypectomy was performed with a cold snare 2.    The examination was otherwise normal  RECOMMENDATIONS: 1. Repeat colonoscopy in 5 years if polyp adenomatous; otherwise 10 years  eSigned:  Eustace Quail, MD 07/22/2014 9:24 AM   cc: Elsie Stain, MD and The Patient

## 2014-07-22 NOTE — Telephone Encounter (Signed)
Pt left v/m; 08/2014 ins will not cover prevacid and pt request substitute med; [facility]ole [facility] . Spoke with pt and explained that lansoprazole is generic for prevacid; pt said OK and he will try to get refilled; if problem pt will cb.

## 2014-07-22 NOTE — Patient Instructions (Signed)
Discharge instructions given. Handout on polyps. Resume previous medications. YOU HAD AN ENDOSCOPIC PROCEDURE TODAY AT THE [facility]: Refer to the procedure report that was given to you for any specific questions about what was found during the examination.  If the procedure report does not answer your questions, please call your gastroenterologist to clarify.  If you requested that your care partner not be given the details of your procedure findings, then the procedure report has been included in a sealed envelope for you to review at your convenience later.  YOU SHOULD EXPECT: Some feelings of bloating in the abdomen. Passage of more gas than usual.  Walking can help get rid of the air that was put into your GI tract during the procedure and reduce the bloating. If you had a lower endoscopy (such as a colonoscopy or flexible sigmoidoscopy) you may notice spotting of blood in your stool or on the toilet paper. If you underwent a bowel prep for your procedure, then you may not have a normal bowel movement for a few days.  DIET: Your first meal following the procedure should be a light meal and then it is ok to progress to your normal diet.  A half-sandwich or bowl of soup is an example of a good first meal.  Heavy or fried foods are harder to digest and may make you feel nauseous or bloated.  Likewise meals heavy in dairy and vegetables can cause extra gas to form and this can also increase the bloating.  Drink plenty of fluids but you should avoid alcoholic beverages for 24 hours.  ACTIVITY: Your care partner should take you home directly after the procedure.  You should plan to take it easy, moving slowly for the rest of the day.  You can resume normal activity the day after the procedure however you should NOT DRIVE or use heavy machinery for 24 hours (because of the sedation medicines used during the test).    SYMPTOMS TO REPORT IMMEDIATELY: A gastroenterologist can be reached at any  hour.  During normal business hours, 8:30 AM to 5:00 PM Monday through Friday, call [phone].  After hours and on weekends, please call the GI answering service at [phone] who will take a message and have the physician on call contact you.   Following lower endoscopy (colonoscopy or flexible sigmoidoscopy):  Excessive amounts of blood in the stool  Significant tenderness or worsening of abdominal pains  Swelling of the abdomen that is new, acute  Fever of 100F or higher  FOLLOW UP: If any biopsies were taken you will be contacted by phone or by letter within the next 1-3 weeks.  Call your gastroenterologist if you have not heard about the biopsies in 3 weeks.  Our staff will call the home number listed on your records the next business day following your procedure to check on you and address any questions or concerns that you may have at that time regarding the information given to you following your procedure. This is a courtesy call and so if there is no answer at the home number and we have not heard from you through the emergency physician on call, we will assume that you have returned to your regular daily activities without incident.  SIGNATURES/CONFIDENTIALITY: You and/or your care partner have signed paperwork which will be entered into your electronic medical record.  These signatures attest to the fact that that the information above on your After Visit Summary has been reviewed and is   understood.  Full responsibility of the confidentiality of this discharge information lies with you and/or your care-partner. 

## 2014-07-23 ENCOUNTER — Telehealth: Payer: Self-pay

## 2014-07-23 NOTE — Telephone Encounter (Signed)
Left message on answering machine. 

## 2014-07-30 ENCOUNTER — Encounter: Payer: Self-pay | Admitting: Internal Medicine

## 2015-04-15 ENCOUNTER — Other Ambulatory Visit: Payer: Self-pay | Admitting: Family Medicine

## 2015-04-15 NOTE — Telephone Encounter (Signed)
Ok to refill 

## 2015-04-15 NOTE — Telephone Encounter (Signed)
Yes, sent.  Please schedule him for CPE when possible.  Thanks.

## 2015-04-16 NOTE — Telephone Encounter (Signed)
Patient notified and CPE scheduled for Dec. Only time that patient could arrange his schedule.

## 2015-07-29 ENCOUNTER — Encounter: Payer: Self-pay | Admitting: Family Medicine

## 2015-07-29 ENCOUNTER — Ambulatory Visit (INDEPENDENT_AMBULATORY_CARE_PROVIDER_SITE_OTHER): Payer: Federal, State, Local not specified - PPO | Admitting: Family Medicine

## 2015-07-29 VITALS — BP 116/82 | HR 90 | Temp 98.2°F | Ht 72.0 in | Wt 236.5 lb

## 2015-07-29 DIAGNOSIS — E785 Hyperlipidemia, unspecified: Secondary | ICD-10-CM | POA: Diagnosis not present

## 2015-07-29 DIAGNOSIS — Z0000 Encounter for general adult medical examination without abnormal findings: Secondary | ICD-10-CM

## 2015-07-29 DIAGNOSIS — E7800 Pure hypercholesterolemia, unspecified: Secondary | ICD-10-CM

## 2015-07-29 DIAGNOSIS — L989 Disorder of the skin and subcutaneous tissue, unspecified: Secondary | ICD-10-CM

## 2015-07-29 LAB — COMPREHENSIVE METABOLIC PANEL
ALBUMIN: 4.5 g/dL (ref 3.5–5.2)
ALK PHOS: 68 U/L (ref 39–117)
ALT: 265 U/L — ABNORMAL HIGH (ref 0–53)
AST: 127 U/L — ABNORMAL HIGH (ref 0–37)
BUN: 23 mg/dL (ref 6–23)
CO2: 28 mEq/L (ref 19–32)
Calcium: 9.8 mg/dL (ref 8.4–10.5)
Chloride: 104 mEq/L (ref 96–112)
Creatinine, Ser: 1.11 mg/dL (ref 0.40–1.50)
GFR: 74.06 mL/min (ref >60.00)
Glucose, Bld: 89 mg/dL (ref 70–99)
POTASSIUM: 4.3 meq/L (ref 3.5–5.1)
SODIUM: 141 meq/L (ref 135–145)
Total Bilirubin: 1.8 mg/dL — ABNORMAL HIGH (ref 0.2–1.2)
Total Protein: 7.6 g/dL (ref 6.0–8.3)

## 2015-07-29 LAB — LIPID PANEL
CHOLESTEROL: 211 mg/dL — AB (ref 0–200)
HDL: 52.9 mg/dL (ref >39.00)
LDL CALC: 133 mg/dL — AB (ref 0–99)
NonHDL: 158.29
TRIGLYCERIDES: 124 mg/dL (ref 0.0–149.0)
Total CHOL/HDL Ratio: 4
VLDL: 24.8 mg/dL (ref 0.0–40.0)

## 2015-07-29 NOTE — Patient Instructions (Addendum)
Go to the lab on the way out.  We'll contact you with your lab report. Take care.  Glad to see you.  Keep working on [employer] and exercise.   Update me if the skin lesions on your neck are changing.

## 2015-07-29 NOTE — Progress Notes (Signed)
Pre visit review using our clinic review tool, if applicable. No additional management support is needed unless otherwise documented below in the visit note.  CPE- See plan.  Routine anticipatory guidance given to patient.  See health maintenance. Tetanus 2014 Flu shot at work ~05/2015.  PNA and shingles shot not due.  Prostate cancer screening and PSA options (with potential risks and benefits of testing vs not testing) were discussed along with recent recs/guidelines. He declined testing PSA at this point. Colonoscopy 2015 Living will d/w pt. Wife designated if patient were incapacitated.  Diet and exercise d/w pt. Doing better with diet than exercise. Living on his own for work during the week, working in [place]. Has a routine work schedule. Discussed. he is doing to work on both.  HIV prev neg.  Done ~2004.   Elevated Cholesterol: Using medications without problems: yes Muscle aches: no Diet compliance:see above Exercise: see above  GERD controlled with PPI, no ADE.  Compliant.    MS per neuro.  Per patient, no change on last MRI.  No new sx, no new tingling, weakness, etc.  Compliant with meds.    PMH and SH reviewed  Meds, vitals, and allergies reviewed.   ROS: See HPI.  Otherwise negative.    GEN: nad, alert and oriented HEENT: mucous membranes moist NECK: supple w/o LA CV: rrr. PULM: ctab, no inc wob ABD: soft, +bs EXT: no edema SKIN: no acute rash but 2 small lesions noted on the L side of the lower neck.  Both are reddish and flat, one is 1cm across, the other if 0.5cm across.  Neither is ulcerated, neither appears alarming.

## 2015-07-30 ENCOUNTER — Other Ambulatory Visit: Payer: Self-pay | Admitting: Family Medicine

## 2015-07-30 DIAGNOSIS — L989 Disorder of the skin and subcutaneous tissue, unspecified: Secondary | ICD-10-CM | POA: Insufficient documentation

## 2015-07-30 DIAGNOSIS — K760 Fatty (change of) liver, not elsewhere classified: Secondary | ICD-10-CM

## 2015-07-30 NOTE — Assessment & Plan Note (Signed)
Tetanus 2014  Flu shot at work ~05/2015.  PNA and shingles shot not due.  Prostate cancer screening and PSA options (with potential risks and benefits of testing vs not testing) were discussed along with recent recs/guidelines. He declined testing PSA at this point.  Colonoscopy 2015  Living will d/w pt. Wife designated if patient were incapacitated.  Diet and exercise d/w pt. Doing better with diet than exercise. Living on his own for work during the week, working in [place]. Has a routine work schedule. Discussed. he is doing to work on both.  HIV prev neg. Done ~2004.

## 2015-07-30 NOTE — Assessment & Plan Note (Signed)
Benign appearing, he took a picture of the lesions.  He can compare in about 1 month, sooner if needed, and update me.  He agrees.

## 2015-07-30 NOTE — Assessment & Plan Note (Signed)
Continue statin.  Needs work on diet and exercise for weight, lipids, and LFTs.  D/w pt.  He agrees.   Can recheck periodically.   Labs d/w pt.

## 2015-09-05 ENCOUNTER — Other Ambulatory Visit (INDEPENDENT_AMBULATORY_CARE_PROVIDER_SITE_OTHER): Payer: Federal, State, Local not specified - PPO

## 2015-09-05 DIAGNOSIS — K760 Fatty (change of) liver, not elsewhere classified: Secondary | ICD-10-CM | POA: Diagnosis not present

## 2015-09-05 LAB — HEPATIC FUNCTION PANEL
ALBUMIN: 4.2 g/dL (ref 3.5–5.2)
ALT: 174 U/L — AB (ref 0–53)
AST: 90 U/L — AB (ref 0–37)
Alkaline Phosphatase: 68 U/L (ref 39–117)
Bilirubin, Direct: 0.2 mg/dL (ref 0.0–0.3)
TOTAL PROTEIN: 7.3 g/dL (ref 6.0–8.3)
Total Bilirubin: 1.5 mg/dL — ABNORMAL HIGH (ref 0.2–1.2)

## 2016-04-08 ENCOUNTER — Other Ambulatory Visit: Payer: Self-pay | Admitting: Family Medicine

## 2016-04-18 ENCOUNTER — Other Ambulatory Visit: Payer: Self-pay | Admitting: Family Medicine

## 2016-10-10 ENCOUNTER — Other Ambulatory Visit: Payer: Self-pay | Admitting: Family Medicine

## 2016-10-10 NOTE — Telephone Encounter (Signed)
He may be moving soon.  Ask patient if he'll be able to come in.  Sent rx for now.  Thanks.

## 2016-10-10 NOTE — Telephone Encounter (Signed)
Last office visit 07/29/2015. Last Lipid 07/29/2015.  No future appointments.  Refill?

## 2016-10-11 NOTE — Telephone Encounter (Signed)
Spoke to patient and was advised that he is in the process of moving out of [place]. Patient stated that he will probably be getting a new doctor, but can come in the first week of April if he needs too.  Advised patient that since he is moving he should get establish with a new doctor in that area. Patient agreed.

## 2016-10-13 NOTE — Telephone Encounter (Signed)
I wish him only the best.  He is reliable.  Reasonable to f/u with new MD instead of here.  I sent this rx to allow him time to get set up.  If for some reason he can't get new MD in time, then let me know re: one last fill.   I was always glad to see him in clinic.  Thanks.

## 2016-10-13 NOTE — Telephone Encounter (Signed)
Patient notified as instructed by telephone and verbalized understanding. 

## 2017-01-07 ENCOUNTER — Other Ambulatory Visit: Payer: Self-pay | Admitting: Family Medicine

## 2017-01-07 NOTE — Telephone Encounter (Signed)
Electronic refill request. Last office visit:   07/29/15 Last Filled:    90 tablet 0 10/10/2016  Please advise.

## 2017-01-08 NOTE — Telephone Encounter (Signed)
Sent.  I thought he was going to est with new MD [place] of state.   Thanks.

## 2017-01-10 NOTE — Telephone Encounter (Signed)
Left detailed message on voicemail.  

## 2017-04-07 ENCOUNTER — Other Ambulatory Visit: Payer: Self-pay | Admitting: Family Medicine

## 2017-04-07 NOTE — Telephone Encounter (Signed)
Last filled 01-10-17 #90. Last OV/CPE/Lipid Panel 07-29-15 No Future OV

## 2017-04-08 NOTE — Telephone Encounter (Signed)
Removed as the PCP

## 2017-04-08 NOTE — Telephone Encounter (Signed)
Was going to set up with new MD [place].  Needs to go through them.  Thanks.   And I need to be removed as PCP.
